# Patient Record
Sex: Male | Born: 1980 | Hispanic: Refuse to answer | Marital: Married | Smoking: Former smoker
Health system: Southern US, Community
[De-identification: ages and names within clinical notes are randomized; demographics above are authoritative.]

## PROBLEM LIST (undated history)

## (undated) DIAGNOSIS — I82409 Acute embolism and thrombosis of unspecified deep veins of unspecified lower extremity: Secondary | ICD-10-CM

## (undated) DIAGNOSIS — T7840XA Allergy, unspecified, initial encounter: Secondary | ICD-10-CM

## (undated) DIAGNOSIS — E785 Hyperlipidemia, unspecified: Secondary | ICD-10-CM

## (undated) HISTORY — DX: Hyperlipidemia, unspecified: E78.5

## (undated) HISTORY — DX: Acute embolism and thrombosis of unspecified deep veins of unspecified lower extremity: I82.409

## (undated) HISTORY — DX: Allergy, unspecified, initial encounter: T78.40XA

## (undated) HISTORY — PX: WISDOM TOOTH EXTRACTION: SHX21

---

## 2019-10-28 ENCOUNTER — Ambulatory Visit: Payer: BC Managed Care – PPO | Attending: Internal Medicine

## 2019-10-28 DIAGNOSIS — Z23 Encounter for immunization: Secondary | ICD-10-CM

## 2019-10-28 NOTE — Progress Notes (Signed)
   Covid-19 Vaccination Clinic  Name:  Gabriel Burgess    MRN: [id number] DOB: Aug 03, 1981  10/28/2019  Mr. Gabriel Burgess was observed post Covid-19 immunization for 15 minutes without incident. He was provided with Vaccine Information Sheet and instruction to access the V-Safe system.   Mr. Gabriel Burgess was instructed to call 911 with any severe reactions post vaccine: Marland Kitchen Difficulty breathing  . Swelling of face and throat  . A fast heartbeat  . A bad rash all over body  . Dizziness and weakness   Immunizations Administered    Name Date Dose VIS Date Route   Pfizer COVID-19 Vaccine 10/28/2019 12:14 PM 0.3 mL 07/22/2019 Intramuscular   Manufacturer: [employer], [employer]   Lot: [id number]   NDC: [address]-2

## 2019-11-22 ENCOUNTER — Ambulatory Visit: Payer: BC Managed Care – PPO | Attending: Internal Medicine

## 2019-11-22 DIAGNOSIS — Z23 Encounter for immunization: Secondary | ICD-10-CM

## 2019-11-22 NOTE — Progress Notes (Signed)
   Covid-19 Vaccination Clinic  Name:  Gabriel Burgess    MRN: [id number] DOB: 23-Jan-1981  11/22/2019  [name] was observed post Covid-19 immunization for 15 minutes without incident. He was provided with Vaccine Information Sheet and instruction to access the V-Safe system.   [name] was instructed to call 911 with any severe reactions post vaccine: Marland Kitchen Difficulty breathing  . Swelling of face and throat  . A fast heartbeat  . A bad rash all over body  . Dizziness and weakness   Immunizations Administered    Name Date Dose VIS Date Route   Pfizer COVID-19 Vaccine 11/22/2019  1:19 PM 0.3 mL 07/22/2019 Intramuscular   Manufacturer: [employer], [employer]   Lot: [id number]   NDC: [address]-1

## 2020-04-30 DIAGNOSIS — Z20828 Contact with and (suspected) exposure to other viral communicable diseases: Secondary | ICD-10-CM | POA: Diagnosis not present

## 2021-10-06 ENCOUNTER — Encounter (HOSPITAL_COMMUNITY): Payer: Self-pay | Admitting: Emergency Medicine

## 2021-10-06 ENCOUNTER — Emergency Department (HOSPITAL_COMMUNITY): Payer: BC Managed Care – PPO

## 2021-10-06 ENCOUNTER — Emergency Department (HOSPITAL_COMMUNITY)
Admission: EM | Admit: 2021-10-06 | Discharge: 2021-10-06 | Disposition: A | Discharge status: Home / Self Care | Payer: BC Managed Care – PPO | Attending: Emergency Medicine | Admitting: Emergency Medicine

## 2021-10-06 ENCOUNTER — Emergency Department (HOSPITAL_BASED_OUTPATIENT_CLINIC_OR_DEPARTMENT_OTHER): Payer: BC Managed Care – PPO

## 2021-10-06 ENCOUNTER — Other Ambulatory Visit: Payer: Self-pay

## 2021-10-06 DIAGNOSIS — M79661 Pain in right lower leg: Secondary | ICD-10-CM | POA: Diagnosis not present

## 2021-10-06 DIAGNOSIS — Z7901 Long term (current) use of anticoagulants: Secondary | ICD-10-CM | POA: Insufficient documentation

## 2021-10-06 DIAGNOSIS — I82451 Acute embolism and thrombosis of right peroneal vein: Secondary | ICD-10-CM | POA: Diagnosis not present

## 2021-10-06 DIAGNOSIS — R0602 Shortness of breath: Secondary | ICD-10-CM | POA: Diagnosis not present

## 2021-10-06 DIAGNOSIS — Z0389 Encounter for observation for other suspected diseases and conditions ruled out: Secondary | ICD-10-CM | POA: Diagnosis not present

## 2021-10-06 DIAGNOSIS — I82401 Acute embolism and thrombosis of unspecified deep veins of right lower extremity: Secondary | ICD-10-CM | POA: Diagnosis not present

## 2021-10-06 LAB — CBC WITH DIFFERENTIAL/PLATELET
Abs Immature Granulocytes: 0.04 10*3/uL (ref 0.00–0.07)
Basophils Absolute: 0.1 10*3/uL (ref 0.0–0.1)
Basophils Relative: 1 %
Eosinophils Absolute: 0.2 10*3/uL (ref 0.0–0.5)
Eosinophils Relative: 3 %
HCT: 44.8 % (ref 39.0–52.0)
Hemoglobin: 14.7 g/dL (ref 13.0–17.0)
Immature Granulocytes: 0 %
Lymphocytes Relative: 20 %
Lymphs Abs: 1.9 10*3/uL (ref 0.7–4.0)
MCH: 29.6 pg (ref 26.0–34.0)
MCHC: 32.8 g/dL (ref 30.0–36.0)
MCV: 90.1 fL (ref 80.0–100.0)
Monocytes Absolute: 0.9 10*3/uL (ref 0.1–1.0)
Monocytes Relative: 9 %
Neutro Abs: 6.5 10*3/uL (ref 1.7–7.7)
Neutrophils Relative %: 67 %
Platelets: 321 10*3/uL (ref 150–400)
RBC: 4.97 MIL/uL (ref 4.22–5.81)
RDW: 12 % (ref 11.5–15.5)
WBC: 9.6 10*3/uL (ref 4.0–10.5)
nRBC: 0 % (ref 0.0–0.2)

## 2021-10-06 LAB — BASIC METABOLIC PANEL
Anion gap: 10 (ref 5–15)
BUN: 11 mg/dL (ref 6–20)
CO2: 23 mmol/L (ref 22–32)
Calcium: 9.3 mg/dL (ref 8.9–10.3)
Chloride: 106 mmol/L (ref 98–111)
Creatinine, Ser: 0.97 mg/dL (ref 0.61–1.24)
GFR, Estimated: >60 mL/min (ref >60)
Glucose, Bld: 114 mg/dL — ABNORMAL HIGH (ref 70–99)
Potassium: 3.9 mmol/L (ref 3.5–5.1)
Sodium: 139 mmol/L (ref 135–145)

## 2021-10-06 MED ORDER — IOHEXOL 350 MG/ML SOLN
75.0000 mL | Freq: Once | INTRAVENOUS | Status: AC | PRN
  Administered 2021-10-06: 75 mL via INTRAVENOUS

## 2021-10-06 MED ORDER — RIVAROXABAN (XARELTO) VTE STARTER PACK (15 & 20 MG): ORAL_TABLET | ORAL | 0 refills | Status: DC

## 2021-10-06 MED ORDER — RIVAROXABAN 15 MG PO TABS
15.0000 mg | ORAL_TABLET | Freq: Once | ORAL | Status: AC
  Administered 2021-10-06: 15 mg via ORAL
  Filled 2021-10-06: qty 1

## 2021-10-06 MED ORDER — RIVAROXABAN (XARELTO) EDUCATION KIT FOR DVT/PE PATIENTS
PACK | Freq: Once | Status: AC
  Filled 2021-10-06: qty 1

## 2021-10-06 NOTE — ED Notes (Signed)
Patient transported to CT 

## 2021-10-06 NOTE — ED Triage Notes (Signed)
Pt reports R calf pain x 4-5 days.  States now having posterior R thigh pain.  C/o increased pain today with redness and swelling.

## 2021-10-06 NOTE — Discharge Instructions (Addendum)
Please return to the ED with any new symptoms such as worsening right calf pain Please pick up the prescription we have sent to your pharmacy.  Please follow directions concerning this medication and asked the pharmacist for any directions. Please call the PCP I referred you on Monday to schedule appointment.  I have attached the number to this document.  She is to [facility]. I have also sent an ambulatory referral to another PCP.  If they were to call you back on Monday, you have your choice of which PCP want to go to. Please avoid taking excessive NSAID therapy. Please read the attached informational brochure I have included that educates on deep vein thromboses.

## 2021-10-06 NOTE — ED Provider Notes (Signed)
[facility] EMERGENCY DEPARTMENT Provider Note   CSN: [id number] Arrival date & time: 10/06/21  0857     History  Chief Complaint  Patient presents with   Leg Pain    Gabriel Burgess is a 41 y.o. male with no provided medical history.  Patient presents ED for evaluation of right lower extremity pain.  Patient states that his right calf began to ache 4 to 5 days ago.  Patient states that the pain comes and goes and is described as a "deep ache".  Patient reports that he has taken Aleve which tends to alleviate the pain but the pain always returns after the medication wears off.  Patient states that beginning yesterday the pain acutely worsened and is now also located in his posterior right thigh.  Patient also endorsing shortness of breath.  Patient also believes that his right leg is more swollen than left.  The patient denies any recent travel or surgery, history of blood clots in family.  Patient denies any recent trauma or excessive exercise.   Leg Pain     Home Medications Prior to Admission medications   Medication Sig Start Date End Date Taking? Authorizing Provider  RIVAROXABAN Alveda Reasons) VTE STARTER PACK (15 & 20 MG) Follow package directions: Take one 47m tablet by mouth twice a day. On day 22, switch to one 216mtablet once a day. Take with food. 10/06/21  Yes GrAzucena CecilPA-C      Allergies    Patient has no known allergies.    Review of Systems   Review of Systems  Respiratory:  Positive for shortness of breath.   Cardiovascular:  Positive for leg swelling (Unilateral on right side).  Musculoskeletal:  Positive for myalgias (Right leg).  All other systems reviewed and are negative.  Physical Exam Updated Vital Signs BP (!) 141/71 (BP Location: Right Arm)    Pulse 73    Temp 98.4 F (36.9 C) (Oral)    Resp 16    SpO2 97%  Physical Exam Vitals and nursing note reviewed.  Constitutional:      General: He is not in acute distress.     Appearance: He is not ill-appearing, toxic-appearing or diaphoretic.  HENT:     Head: Normocephalic and atraumatic.     Nose: Nose normal. No congestion.     Mouth/Throat:     Mouth: Mucous membranes are moist.  Eyes:     Extraocular Movements: Extraocular movements intact.     Conjunctiva/sclera: Conjunctivae normal.     Pupils: Pupils are equal, round, and reactive to light.  Cardiovascular:     Rate and Rhythm: Normal rate and regular rhythm.  Pulmonary:     Effort: Pulmonary effort is normal.     Breath sounds: Normal breath sounds. No wheezing.  Abdominal:     General: Abdomen is flat.     Palpations: Abdomen is soft.     Tenderness: There is no abdominal tenderness.  Musculoskeletal:     Cervical back: Normal range of motion and neck supple. No tenderness.     Right lower leg: Tenderness present. No edema.     Left lower leg: Normal.  Skin:    General: Skin is warm and dry.     Capillary Refill: Capillary refill takes less than 2 seconds.  Neurological:     Mental Status: He is alert and oriented to person, place, and time.    ED Results / Procedures / Treatments   Labs (all labs  ordered are listed, but only abnormal results are displayed) Labs Reviewed  BASIC METABOLIC PANEL - Abnormal; Notable for the following components:      Result Value   Glucose, Bld 114 (*)    All other components within normal limits  CBC WITH DIFFERENTIAL/PLATELET    EKG None  Radiology CT Angio Chest PE W and/or Wo Contrast  Result Date: 10/06/2021 CLINICAL DATA:  Pulmonary embolism (PE) suspected, high prob positive DVT study EXAM: CT ANGIOGRAPHY CHEST WITH CONTRAST TECHNIQUE: Multidetector CT imaging of the chest was performed using the standard protocol during bolus administration of intravenous contrast. Multiplanar CT image reconstructions and MIPs were obtained to evaluate the vascular anatomy. RADIATION DOSE REDUCTION: This exam was performed according to the departmental  dose-optimization program which includes automated exposure control, adjustment of the mA and/or kV according to patient size and/or use of iterative reconstruction technique. CONTRAST:  18m OMNIPAQUE IOHEXOL 350 MG/ML SOLN COMPARISON:  None. FINDINGS: Cardiovascular: Suboptimal opacification of the pulmonary arteries due to contrast bolus timing. No central or lobar acute pulmonary embolism. Possible distal segmental and subsegmental emboli right upper and lower lobes. Normal heart size. No pericardial effusion. Mediastinum/Nodes: Normal thyroid. No enlarged nodes. Esophagus is unremarkable. Lungs/Pleura: No consolidation or mass. No pleural effusion or pneumothorax. Upper Abdomen: No acute abnormality. Musculoskeletal: No chest wall abnormality. No acute or significant osseous findings. Review of the MIP images confirms the above findings. IMPRESSION: Suboptimal contrast bolus timing. No central or lobar acute pulmonary embolism. Possible distal segmental and subsegmental emboli right upper and lower lobes could also reflect artifact. Electronically Signed   By: PMacy MisM.D.   On: 10/06/2021 11:24   VAS [place]OWER EXTREMITY VENOUS (DVT) (ONLY [facility] & [facility])  Result Date: 10/06/2021  Lower Venous DVT Study Patient Name:  Gabriel Burgess Date of Exam:   10/06/2021 Medical Rec #: [id number]      Accession #:    [id number] of Birth: [birth year]     Patient Gender: M Patient Age:   89years [facility]      VAS [place]OWER EXTREMITY VENOUS (DVT) Referring Phys: CHarrell GaveGROCE --------------------------------------------------------------------------------  Indications: Calf pain X 3 to 4 days.  Comparison Study: No prior study on file Performing Technologist: CSharion DoveRVS  Examination Guidelines: A complete evaluation includes B-mode imaging, spectral Doppler, color Doppler, and power Doppler as needed of all accessible portions of each vessel. Bilateral testing is  considered an integral part of a complete examination. Limited examinations for reoccurring indications may be performed as noted. The reflux portion of the exam is performed with the patient in reverse Trendelenburg.  +---------+---------------+---------+-----------+----------+--------------+  RIGHT     Compressibility Phasicity Spontaneity Properties Thrombus Aging  +---------+---------------+---------+-----------+----------+--------------+  CFV       Full            Yes       Yes                                    +---------+---------------+---------+-----------+----------+--------------+  SFJ       Full                                                             +---------+---------------+---------+-----------+----------+--------------+  FV Prox   Full                                                             +---------+---------------+---------+-----------+----------+--------------+  FV Mid    Full                                                             +---------+---------------+---------+-----------+----------+--------------+  FV Distal Full                                                             +---------+---------------+---------+-----------+----------+--------------+  PFV       Full                                                             +---------+---------------+---------+-----------+----------+--------------+  POP       None            No        No                     Acute           +---------+---------------+---------+-----------+----------+--------------+  PTV       None                                             Acute           +---------+---------------+---------+-----------+----------+--------------+  PERO      None                                             Acute           +---------+---------------+---------+-----------+----------+--------------+   +----+---------------+---------+-----------+----------+--------------+   LEFT Compressibility Phasicity Spontaneity Properties Thrombus Aging  +----+---------------+---------+-----------+----------+--------------+  CFV  Full            Yes       Yes                                    +----+---------------+---------+-----------+----------+--------------+     Summary: RIGHT: - Findings consistent with acute deep vein thrombosis involving the right popliteal vein, right posterior tibial veins, and right peroneal veins. - No cystic structure found in the popliteal fossa.  LEFT: - No evidence of common femoral vein obstruction.  *See table(s) above for measurements and observations.    Preliminary     Procedures Procedures    Medications  Ordered in ED Medications  Rivaroxaban (XARELTO) tablet 15 mg (has no administration in time range)  rivaroxaban (XARELTO) Education Kit for DVT/PE patients (has no administration in time range)  iohexol (OMNIPAQUE) 350 MG/ML injection 75 mL (75 mLs Intravenous Contrast Given 10/06/21 1117)    ED Course/ Medical Decision Making/ A&P                           Medical Decision Making Amount and/or Complexity of Data Reviewed Labs: ordered. ECG/medicine tests: ordered.   41 year old male presents to ED for evaluation of right calf pain.  Patient states that the Calf pain has acutely worsened over the last 2 days which is caused him to present to the ED. On examination, the patient's right calf has no signs of erythema, ecchymosis.  There is slight tenderness to palpation of the right calf when the patient has his right foot dorsiflexed, positive Homans' sign.  Patient's right foot has 2+ DP pulse, less than 2-second capillary refill.  Patient worked up utilizing following labs and imaging studies: -CBC: Unremarkable - BMP: Unremarkable - DVT ultrasound: Shows acute DVT in the right popliteal, right peroneal, posterior tibial veins.  Due to patient also complaining of shortness of breath, we will proceed with CT angiogram.   - CT  angiogram: Shows possible distal and subsegmental emboli in the right upper and lower lobe.  Could also reflect artifact.  At this time, the patient is stable for discharge.  I will begin the patient on 3 months of anticoagulation, Xarelto starter pack.  I have provided the patient with education regarding his diagnosis as well as the medication he is on now.  I provided the patient with discharge precautions as well as precautions to take while on the blood thinning medication.  I have advised the patient that if he were to get a car accident or hit his head he needs to present immediately to the ED for CT head to rule out a brain bleed.  The patient does not have a PCP, I have referred him to one.  I sent an ambulatory referral to a PCP provider in the area as well as referred him to the [facility].  I have advised him that he will need close follow-up and he will need to get into see a PCP this week.  I have advised the patient that if he is unable to get to a PCP, needs to return to the ED for further evaluation and management concerning his DVT.  Patient had all of his questions answered to his satisfaction.  The patient is stable for discharge at this time.   Final Clinical Impression(s) / ED Diagnoses Final diagnoses:  Acute deep vein thrombosis (DVT) of right peroneal vein ([place])    Rx / DC Orders ED Discharge Orders          Ordered    Ambulatory referral to Family Practice       Comments: DVT positive patient recently started on Xarelto starter pack. Does not have PCP and needs close follow up.   10/06/21 1151    RIVAROXABAN (XARELTO) VTE STARTER PACK (15 & 20 MG)        10/06/21 1151              Lawana Chambers 10/06/21 [place], Ankit, MD 10/07/21 1058

## 2021-10-06 NOTE — Progress Notes (Signed)
VASCULAR LAB    Right lower extremity venous duplex has been performed.  See CV proc for preliminary results.  Messaged results to Delice Bison, PA-C via secure chat.  Adeana Grilliot, RVT 10/06/2021, 10:23 AM

## 2021-10-08 ENCOUNTER — Encounter: Payer: Self-pay | Admitting: Nurse Practitioner

## 2021-10-08 ENCOUNTER — Other Ambulatory Visit: Payer: Self-pay

## 2021-10-08 ENCOUNTER — Ambulatory Visit (INDEPENDENT_AMBULATORY_CARE_PROVIDER_SITE_OTHER): Payer: BC Managed Care – PPO | Admitting: Nurse Practitioner

## 2021-10-08 VITALS — BP 123/78 | HR 82

## 2021-10-08 DIAGNOSIS — I82451 Acute embolism and thrombosis of right peroneal vein: Secondary | ICD-10-CM | POA: Diagnosis not present

## 2021-10-08 NOTE — Patient Instructions (Addendum)
DVT Possible PE:  Outpatient Encounter Medications as of 10/08/2021  Medication Sig   RIVAROXABAN (XARELTO) VTE STARTER PACK (15 & 20 MG) Follow package directions: Take one 15mg  tablet by mouth twice a day. On day 22, switch to one 20mg  tablet once a day. Take with food.   No facility-administered encounter medications on file as of 10/08/2021.     Continue xarelto  Heart healthy diet  Follow up:  Follow up in 4 weeks - Physical and follow up DVT/anticoagulation    Rivaroxaban Tablets What is this medication? RIVAROXABAN (ri va ROX a ban) prevents or treats blood clots. It is also used to lower the risk of stroke in people with AFib (atrial fibrillation). It can be used to lower the risk of heart attack or stroke in people with heart or peripheral artery disease. It belongs to a group of medications called blood thinners. This medicine may be used for other purposes; ask your health care provider or pharmacist if you have questions. COMMON BRAND NAME(S): Xarelto, Xarelto Starter Pack What should I tell my care team before I take this medication? They need to know if you have any of these conditions: Antiphospholipid antibody syndrome Bleeding disorders Bleeding in the brain Blood clots Kidney disease Liver disease Prosthetic heart valve Recent or planned spinal or epidural procedure Stomach bleeding Take medications that treat or prevent blood clots An unusual or allergic reaction to rivaroxaban, other medications, foods, dyes, or preservatives Pregnant or trying to get pregnant Breast-feeding How should I use this medication? Take this medication by mouth. For your therapy to work as well as possible, take each dose exactly as prescribed on the prescription label. Do not skip doses. Skipping doses or stopping this medication can increase your risk of a blood clot or stroke. Keep taking this medication unless your care team tells you to stop. If you are taking this  medication after hip or knee replacement surgery, take it with or without food. If you are taking this medication for atrial fibrillation, take it with your evening meal. If you are taking this medication to treat blood clots, take it with food at the same time each day. If you are taking this medication for coronary artery disease or peripheral artery disease, take it with or without food at the same time every day. If you are unable to swallow your tablet, you may crush the tablet and mix it in applesauce. Then, immediately eat the applesauce. You should eat more food right after you eat the applesauce containing the crushed tablet. A special MedGuide will be given to you by the pharmacist with each prescription and refill. Be sure to read this information carefully each time. Talk to your care team about the use of this medication in children. While it may be prescribed for children as young as newborns for selected conditions, precautions do apply. Overdosage: If you think you have taken too much of this medicine contact a poison control center or emergency room at once. NOTE: This medicine is only for you. Do not share this medicine with others. What if I miss a dose? If you take your medication once a day and miss a dose, take it as soon as you can. If it is almost time for your next dose, take only that dose. Do not take double or extra doses. If you are taking this medication twice a day to treat blood clots and miss a dose, take the missed dose as soon as you remember. In  this instance, 2 tablets may be taken at the same time. The next day you should take 1 tablet twice a day. If you are taking this medication twice a day for coronary artery disease or peripheral artery disease and miss a dose, skip it. Take your next dose at the normal time. Do not take extra or 2 doses at the same time to make up for the missed dose. What may interact with this medication? Do not take this medication with any of  the following: Defibrotide This medication may also interact with the following: Aspirin and aspirin-like medications Certain antibiotics like erythromycin and clarithromycin Certain medications for fungal infections like ketoconazole and itraconazole Certain medications for seizures like carbamazepine, phenytoin Certain medications that treat or prevent blood clots like warfarin, enoxaparin, dalteparin, apixaban, dabigatran, and edoxaban Conivaptan Indinavir Lopinavir; ritonavir NSAIDS, medications for pain and inflammation, like ibuprofen or naproxen Rifampin Ritonavir SNRIs, medications for depression, like desvenlafaxine, duloxetine, levomilnacipran, venlafaxine SSRIs, medications for depression, like citalopram, escitalopram, fluoxetine, fluvoxamine, paroxetine, sertraline St. John's wort This list may not describe all possible interactions. Give your health care provider a list of all the medicines, herbs, non-prescription drugs, or dietary supplements you use. Also tell them if you smoke, drink alcohol, or use illegal drugs. Some items may interact with your medicine. What should I watch for while using this medication? Visit your care team for regular checks on your progress. You may need blood work done while you are taking this medication. Your condition will be monitored carefully while you are receiving this medication. It is important not to miss any appointments. Avoid sports and activities that might cause injury while you are using this medication. Severe falls or injuries can cause unseen bleeding. Be careful when using sharp tools or knives. Consider using an Neurosurgeon. Take special care brushing or flossing your teeth. Report any injuries, bruising, or red spots on the skin to your care team. Wear a medical ID bracelet or chain. Carry a card that describes your condition. List the medications and doses you take on the card. Tell your dentist and dental surgeon that you  are taking this medication. You should not have major dental surgery while on this medication. See your dentist to have a dental exam and fix any dental problems before starting this medication. Take good care of your teeth while on this medication. Make sure you see your dentist for regular follow-up appointments. If you are going to need surgery or other procedure, tell your care team that you are using this medication. Do not become pregnant while taking this medication. Women should inform their care team if they wish to become pregnant or think they might be pregnant. There is potential for serious harm to an unborn child. Talk to your care team for more information. What side effects may I notice from receiving this medication? Side effects that you should report to your care team as soon as possible: Allergic reactions--skin rash, itching, hives, swelling of the face, lips, tongue, or throat Bleeding--bloody or black, tar-like stools, vomiting blood or brown material that looks like coffee grounds, red or dark brown urine, small red or purple spots on skin, unusual bruising or bleeding Bleeding in the brain--severe headache, stiff neck, confusion, dizziness, change in vision, numbness or weakness of the face, arm, or leg, trouble speaking, trouble walking, vomiting Heavy periods This list may not describe all possible side effects. Call your doctor for medical advice about side effects. You may report side effects  to FDA at 1-800-FDA-1088. Where should I keep my medication? Keep out of the reach of children and pets. Store at room temperature between 20 and 25 degrees C (68 and 77 degrees F). Get rid of any unused medication after the expiration date. To get rid of medications that are no longer needed or have expired: Take the medication to a medication take-back program. Check your pharmacy or law enforcement to find a location. If you cannot return the medication, check the label or package  insert to see if the medication should be thrown out in the garbage or flushed down the toilet. If you are not sure, ask your care team. If it is safe to put it in the trash, empty the medication out of the container. Mix the medication with cat litter, dirt, coffee grounds, or other unwanted substance. Seal the mixture in a bag or container. Put it in the trash. NOTE: This sheet is a summary. It may not cover all possible information. If you have questions about this medicine, talk to your doctor, pharmacist, or health care provider.  2022 Elsevier/Gold Standard (2020-09-21 00:00:00)   Deep Vein Thrombosis Deep vein thrombosis (DVT) is a condition in which a blood clot forms in a vein of the deep venous system. This can occur in the lower leg, thigh, pelvis, arm, or neck. A clot is blood that has thickened into a gel or solid. This condition is serious and can be life-threatening if the clot travels to the arteries of the lungs and causes a blockage (pulmonary embolism). A DVT can also damage veins in the leg, which can lead to long-term venous disease, leg pain, swelling, discoloration, and ulcers or sores (post-thrombotic syndrome). What are the causes? This condition may be caused by: A slowdown of blood flow. Damage to a vein. A condition that causes blood to clot more easily, such as certain bleeding disorders. What increases the risk? The following factors may make you more likely to develop this condition: Obesity. Being older, especially older than age 42. Being inactive or not moving around (sedentary lifestyle). This may include: Sitting or lying down for longer than 4-6 hours other than to sleep at night. Being in the hospital, or having major or lengthy surgery. Having any recent bone injuries, such as breaks (fractures), that reduce movement, especially in the lower extremities. Having recent orthopedic surgery on the lower extremities. Being pregnant, giving birth, or having  recently given birth. Taking medicines that contain estrogen, such as birth control or hormone replacement therapy. Using products that contain nicotine or tobacco, especially if you use hormonal birth control. Having a history of a blood vessel disease (peripheral vascular disease) or congestive heart disease. Having a history of cancer, especially if being treated with chemotherapy. What are the signs or symptoms? Symptoms of this condition include: Swelling, pain, pressure, or tenderness in an arm or a leg. An arm or a leg becoming warm, red, or discolored. A leg turning very pale or blue. You may have a large DVT. This is rare. If the clot is in your leg, you may notice that symptoms get worse when you stand or walk. In some cases, there are no symptoms. How is this diagnosed? This condition is diagnosed with: Your medical history and a physical exam. Tests, such as: Blood tests to check how well your blood clots. Doppler ultrasound. This is the best way to find a DVT. CT venogram. Contrast dye is injected into a vein, and X-rays are taken to  check for clots. This is helpful for veins in the chest or pelvis. How is this treated? Treatment for this condition depends on: The cause of your DVT. The size and location of your DVT, or having more than one DVT. Your risk for bleeding or developing more clots. Other medical conditions you may have. Treatment may include: Taking a blood thinner medicine (anticoagulant) to prevent more clots from forming or current clots from growing. Wearing compression stockings. Injecting medicines into the affected vein to break up the clot (catheter-directed thrombolysis). Surgical procedures, when DVT is severe or hard to treat. These may be done to: Isolate and remove your clot. Place an inferior vena cava (IVC) filter. This filter is placed into a large vein called the inferior vena cava to catch blood clots before they reach your lungs. You may get  some medical treatments for 6 months or longer. Follow these instructions at home: If you are taking blood thinners: Talk with your health care provider before you take any medicines that contain aspirin or NSAIDs, such as ibuprofen. These medicines increase your risk for dangerous bleeding. Take your medicine exactly as told, at the same time every day. Do not skip a dose. Do not take more than the prescribed dose. This is important. Ask your health care provider about foods and medicines that could change or interact with the way your blood thinner works. Avoid these foods and medicines if you are told to do so. Avoid anything that may cause bleeding or bruising. You may bleed more easily while taking blood thinners. Be very careful when using knives, scissors, or other sharp objects. Use an electric razor instead of a blade. Avoid activities that could cause injury or bruising, and follow instructions for preventing falls. Tell your health care provider if you have had any internal bleeding, bleeding ulcers, or neurologic diseases, such as strokes or cerebral aneurysms. Wear a medical alert bracelet or carry a card that lists what medicines you take. General instructions Take over-the-counter and prescription medicines only as told by your health care provider. Return to your normal activities as told by your health care provider. Ask your health care provider what activities are safe for you. If recommended, wear compression stockings as told by your health care provider. These stockings help to prevent blood clots and reduce swelling in your legs. Never wear your compression stockings while sleeping at night. Keep all follow-up visits. This is important. Where to find more information American Heart Association: www.heart.org Centers for Disease Control and Prevention: FootballExhibition.com.br National Heart, Lung, and Blood Institute: PopSteam.is Contact a health care provider if: You miss a  dose of your blood thinner. You have unusual bruising or other color changes. You have new or worse pain, swelling, or redness in an arm or a leg. You have worsening numbness or tingling in an arm or a leg. You have a significant color change (pale or blue) in the extremity that has the DVT. Get help right away if: You have signs or symptoms that a blood clot has moved to the lungs. These may include: Shortness of breath. Chest pain. Fast or irregular heartbeats (palpitations). Light-headedness, dizziness, or fainting. Coughing up blood. You have signs or symptoms that your blood is too thin. These may include: Blood in your vomit, stool, or urine. A cut that will not stop bleeding. A menstrual period that is heavier than usual. A severe headache or confusion. These symptoms may be an emergency. Get help right away. Call 911. Do  not wait to see if the symptoms will go away. Do not drive yourself to the hospital. Summary Deep vein thrombosis (DVT) happens when a blood clot forms in a deep vein. This may occur in the lower leg, thigh, pelvis, arm, or neck. Symptoms affect the arm or leg and can include swelling, pain, tenderness, warmth, redness, or discoloration. This condition may be treated with medicines. In severe cases, a procedure or surgery may be done to remove or dissolve the clots. If you are taking blood thinners, take them exactly as told. Do not skip a dose. Do not take more than is prescribed. Get help right away if you have a severe headache, shortness of breath, chest pain, fast or irregular heartbeats, or blood in your vomit, urine, or stool. This information is not intended to replace advice given to you by your health care provider. Make sure you discuss any questions you have with your health care provider. Document Revised: 02/18/2021 Document Reviewed: 02/18/2021 Elsevier Patient Education  2022 [employer]

## 2021-10-08 NOTE — Assessment & Plan Note (Signed)
Possible PE:  Outpatient Encounter Medications as of 10/08/2021  Medication Sig   RIVAROXABAN (XARELTO) VTE STARTER PACK (15 & 20 MG) Follow package directions: Take one 15mg  tablet by mouth twice a day. On day 22, switch to one 20mg  tablet once a day. Take with food.   No facility-administered encounter medications on file as of 10/08/2021.     Continue xarelto  Heart healthy diet  Follow up:  Follow up in 4 weeks - Physical and follow up DVT/anticoagulation

## 2021-10-08 NOTE — Progress Notes (Signed)
@Patient  ID: Gabriel Burgess, male    DOB: 1980-08-26, 41 y.o.   MRN: [id number]  Chief Complaint  Patient presents with   Hospitalization Follow-up    Referring provider: No ref. provider found   HPI   Patient presents today for a hospital follow-up.  He was seen in the ED on 10/06/2021 and diagnosed with DVT.  Ultrasound showed DVT in the right popliteal right peroneal, posterior tibial veins.  CT angiogram showed possible distal as so segmental emboli in the right upper and lower lobe.  It was noted that this could also reflect artifact.  Patient was started on anticoagulation therapy with Xarelto.  Patient states that he was able to get his prescription filled and has started the medication.  He states that overall he is doing well.  His vital signs are stable in the office today.  Patient does not currently have a PCP and would like to establish care here at this office.  He states that he has not had a physical since 2016.  We discussed that we will get him back in for complete physical in the near future. Denies f/c/s, n/v/d, hemoptysis, PND, chest pain or edema.     No Known Allergies  Immunization History  Administered Date(s) Administered   PFIZER(Purple Top)SARS-COV-2 Vaccination 10/28/2019, 11/22/2019    No past medical history on file.  Tobacco History: Social History   Tobacco Use  Smoking Status Never  Smokeless Tobacco Never   Counseling given: Yes   Outpatient Encounter Medications as of 10/08/2021  Medication Sig   RIVAROXABAN (XARELTO) VTE STARTER PACK (15 & 20 MG) Follow package directions: Take one 15mg  tablet by mouth twice a day. On day 22, switch to one 20mg  tablet once a day. Take with food.   No facility-administered encounter medications on file as of 10/08/2021.     Review of Systems  Review of Systems  Constitutional: Negative.   HENT: Negative.    Respiratory:  Positive for cough and shortness of breath.   Cardiovascular: Negative.    Gastrointestinal: Negative.   Musculoskeletal:        Slight redness and warmth to right calf  Allergic/Immunologic: Negative.   Neurological: Negative.   Psychiatric/Behavioral: Negative.        Physical Exam  BP 123/78    Pulse 82    SpO2 97%   Wt Readings from Last 5 Encounters:  10/06/21 200 lb (90.7 kg)     Physical Exam Vitals and nursing note reviewed.  Constitutional:      General: He is not in acute distress.    Appearance: He is well-developed.  Cardiovascular:     Rate and Rhythm: Normal rate and regular rhythm.  Pulmonary:     Effort: Pulmonary effort is normal.     Breath sounds: Normal breath sounds.  Skin:    General: Skin is warm and dry.  Neurological:     Mental Status: He is alert and oriented to person, place, and time.      Imaging: CT Angio Chest PE W and/or Wo Contrast  Result Date: 10/06/2021 CLINICAL DATA:  Pulmonary embolism (PE) suspected, high prob positive DVT study EXAM: CT ANGIOGRAPHY CHEST WITH CONTRAST TECHNIQUE: Multidetector CT imaging of the chest was performed using the standard protocol during bolus administration of intravenous contrast. Multiplanar CT image reconstructions and MIPs were obtained to evaluate the vascular anatomy. RADIATION DOSE REDUCTION: This exam was performed according to the departmental dose-optimization program which includes automated exposure control, adjustment of the  mA and/or kV according to patient size and/or use of iterative reconstruction technique. CONTRAST:  46mL OMNIPAQUE IOHEXOL 350 MG/ML SOLN COMPARISON:  None. FINDINGS: Cardiovascular: Suboptimal opacification of the pulmonary arteries due to contrast bolus timing. No central or lobar acute pulmonary embolism. Possible distal segmental and subsegmental emboli right upper and lower lobes. Normal heart size. No pericardial effusion. Mediastinum/Nodes: Normal thyroid. No enlarged nodes. Esophagus is unremarkable. Lungs/Pleura: No consolidation or mass.  No pleural effusion or pneumothorax. Upper Abdomen: No acute abnormality. Musculoskeletal: No chest wall abnormality. No acute or significant osseous findings. Review of the MIP images confirms the above findings. IMPRESSION: Suboptimal contrast bolus timing. No central or lobar acute pulmonary embolism. Possible distal segmental and subsegmental emboli right upper and lower lobes could also reflect artifact. Electronically Signed   By: Guadlupe Spanish M.D.   On: 10/06/2021 11:24   VAS [place] LOWER EXTREMITY VENOUS (DVT) (ONLY [facility] & [facility])  Result Date: 10/06/2021  Lower Venous DVT Study Patient Name:  JIDENNA FIGGS  Date of Exam:   10/06/2021 Medical Rec #: [id number]       Accession #:    [id number] Date of Birth: [birth year]      Patient Gender: M Patient Age:   3 years Exam Location:  [facility] Procedure:      VAS [place] LOWER EXTREMITY VENOUS (DVT) Referring Phys: Cristal Deer GROCE --------------------------------------------------------------------------------  Indications: Calf pain X 3 to 4 days.  Comparison Study: No prior study on file Performing Technologist: Sherren Kerns RVS  Examination Guidelines: A complete evaluation includes B-mode imaging, spectral Doppler, color Doppler, and power Doppler as needed of all accessible portions of each vessel. Bilateral testing is considered an integral part of a complete examination. Limited examinations for reoccurring indications may be performed as noted. The reflux portion of the exam is performed with the patient in reverse Trendelenburg.  +---------+---------------+---------+-----------+----------+--------------+  RIGHT     Compressibility Phasicity Spontaneity Properties Thrombus Aging  +---------+---------------+---------+-----------+----------+--------------+  CFV       Full            Yes       Yes                                    +---------+---------------+---------+-----------+----------+--------------+  SFJ       Full                                                              +---------+---------------+---------+-----------+----------+--------------+  FV Prox   Full                                                             +---------+---------------+---------+-----------+----------+--------------+  FV Mid    Full                                                             +---------+---------------+---------+-----------+----------+--------------+  FV Distal Full                                                             +---------+---------------+---------+-----------+----------+--------------+  PFV       Full                                                             +---------+---------------+---------+-----------+----------+--------------+  POP       None            No        No                     Acute           +---------+---------------+---------+-----------+----------+--------------+  PTV       None                                             Acute           +---------+---------------+---------+-----------+----------+--------------+  PERO      None                                             Acute           +---------+---------------+---------+-----------+----------+--------------+   +----+---------------+---------+-----------+----------+--------------+  LEFT Compressibility Phasicity Spontaneity Properties Thrombus Aging  +----+---------------+---------+-----------+----------+--------------+  CFV  Full            Yes       Yes                                    +----+---------------+---------+-----------+----------+--------------+     Summary: RIGHT: - Findings consistent with acute deep vein thrombosis involving the right popliteal vein, right posterior tibial veins, and right peroneal veins. - No cystic structure found in the popliteal fossa.  LEFT: - No evidence of common femoral vein obstruction.  *See table(s) above for measurements and observations. Electronically signed by Sherald Hess MD on 10/06/2021 at 12:09:35 PM.    Final       Assessment & Plan:   No problem-specific Assessment & Plan notes found for this encounter.     Ivonne Andrew, NP 10/08/2021

## 2021-10-25 NOTE — Progress Notes (Signed)
? ? ?Patient ID: Gabriel Burgess, male    DOB: 1980/08/27  MRN: [id number] ? ?CC: Annual Physical Exam ? ?Subjective: ?Gabriel Burgess is a 41 y.o. male who presents for annual physical exam.  ? ?His concerns today include:  ?DVT FOLLOW-UP: ?Right lower extremity pain has improved. Notices swelling when standing for extensive amounts of time. Would like to know how long he will have to remain on the medication. Also, concern for familial history of factor 5 deficiency. Having lower back pain for at least 12 months wants to know if NSAID's ok to take as Tylenol is not helping.  ? ?2. CLOGGED EAR, RIGHT: ?Persisting for at least 12 months. Noticed summer 2022 after his wife had Covid. Has noticed decreased hearing of the same ear. Denies frequent ear infections, sinus or allergy issues.  ? ? ?Patient Active Problem List  ? Diagnosis Date Noted  ? Acute deep vein thrombosis (DVT) of right peroneal vein (HCC) 10/08/2021  ?  ? ?Current Outpatient Medications on File Prior to Visit  ?Medication Sig Dispense Refill  ? RIVAROXABAN (XARELTO) VTE STARTER PACK (15 & 20 MG) Follow package directions: Take one 15mg  tablet by mouth twice a day. On day 22, switch to one 20mg  tablet once a day. Take with food. 51 each 0  ? ?No current facility-administered medications on file prior to visit.  ? ? ?Allergies  ?Allergen Reactions  ? Amoxicillin   ? ? ?Social History  ? ?Socioeconomic History  ? Marital status: Unknown  ?  Spouse name: Not on file  ? Number of children: Not on file  ? Years of education: Not on file  ? Highest education level: Not on file  ?Occupational History  ? Not on file  ?Tobacco Use  ? Smoking status: Former  ?  Packs/day: 0.50  ?  Types: Cigarettes  ?  Passive exposure: Past  ? Smokeless tobacco: Never  ?Substance and Sexual Activity  ? Alcohol use: Yes  ?  Comment: occassionally  ? Drug use: Never  ? Sexual activity: Yes  ?  Partners: Female  ?Other Topics Concern  ? Not on file  ?Social History Narrative  ?  Not on file  ? ?Social Determinants of Health  ? ?Financial Resource Strain: Not on file  ?Food Insecurity: Not on file  ?Transportation Needs: Not on file  ?Physical Activity: Not on file  ?Stress: Not on file  ?Social Connections: Not on file  ?Intimate Partner Violence: Not on file  ? ? ?No family history on file. ? ?History reviewed. No pertinent surgical history. ? ?ROS: ?Review of Systems ?Negative except as stated above ? ?PHYSICAL EXAM: ?BP 124/75 (BP Location: Left Arm, Patient Position: Sitting, Cuff Size: Normal)   Pulse 81   Temp 98.3 ?F (36.8 ?C)   Resp 18   Ht 5\' 10"  (1.778 m)   Wt 198 lb (89.8 kg)   SpO2 93%   BMI 28.41 kg/m?  ? ?Physical Exam ?HENT:  ?   Head: Normocephalic and atraumatic.  ?   Right Ear: Tympanic membrane, ear canal and external ear normal.  ?   Left Ear: Tympanic membrane, ear canal and external ear normal.  ?   Nose: Nose normal.  ?   Mouth/Throat:  ?   Mouth: Mucous membranes are moist.  ?   Pharynx: Oropharynx is clear.  ?Eyes:  ?   Extraocular Movements: Extraocular movements intact.  ?   Conjunctiva/sclera: Conjunctivae normal.  ?   Pupils: Pupils are equal,  round, and reactive to light.  ?Cardiovascular:  ?   Rate and Rhythm: Normal rate and regular rhythm.  ?   Pulses: Normal pulses.  ?   Heart sounds: Normal heart sounds.  ?Pulmonary:  ?   Effort: Pulmonary effort is normal.  ?   Breath sounds: Normal breath sounds.  ?Abdominal:  ?   General: Bowel sounds are normal.  ?   Palpations: Abdomen is soft.  ?Genitourinary: ?   Comments: Patient declined.  ?Musculoskeletal:     ?   General: Normal range of motion.  ?   Cervical back: Normal range of motion and neck supple.  ?Skin: ?   General: Skin is warm and dry.  ?   Capillary Refill: Capillary refill takes less than 2 seconds.  ?Neurological:  ?   General: No focal deficit present.  ?   Mental Status: He is alert and oriented to person, place, and time.  ?Psychiatric:     ?   Mood and Affect: Mood normal.     ?    Behavior: Behavior normal.  ? ? ?ASSESSMENT AND PLAN: ?1. Annual physical exam: ?- Counseled on 150 minutes of exercise per week as tolerated, healthy eating (including decreased daily intake of saturated fats, cholesterol, added sugars, sodium), STI prevention, and routine healthcare maintenance. ? ?2. Screening for metabolic disorder: ?- Hepatic panel to screen liver function. Patient plans to return tomorrow morning for fasting labs.  ?- Hepatic Function Panel; Future ? ?3. Diabetes mellitus screening: ?- Hemoglobin A1c to screen for pre-diabetes/diabetes. Patient plans to return tomorrow morning for fasting labs.  ?- Hemoglobin A1c; Future ? ?4. Screening cholesterol level: ?- Lipid panel to screen for high cholesterol. Patient plans to return tomorrow morning for fasting labs.  ?- Lipid panel; Future ? ?5. Thyroid disorder screen: ?- TSH to check thyroid function. Patient plans to return tomorrow morning for fasting labs.  ?- TSH; Future ? ?6. Need for hepatitis C screening test: ?- Hepatitis C antibody to screen for hepatitis C. Patient plans to return tomorrow morning for fasting labs.  ?- Hepatitis C Antibody; Future ? ?7. Encounter for screening for HIV: ?- HIV antibody to screen for human immunodeficiency virus. Patient plans to return tomorrow morning for fasting labs.  ?- HIV antibody (with reflex); Future ? ?8. Acute deep vein thrombosis (DVT) of right peroneal vein (HCC): ?- Continue Rivaroxaban as prescribed.  ?- Referral to Vascular Surgery for further evaluation and management.  ?- rivaroxaban (XARELTO) 20 MG TABS tablet; Take 1 tablet (20 mg total) by mouth daily with supper.  Dispense: 30 tablet; Refill: 0 ?- Ambulatory referral to Vascular Surgery ? ?9. Clogged ear, right: ?10. Decreased hearing, right: ?- Referral to ENT for further evaluation and management.  ?- Ambulatory referral to ENT ? ?11. Chronic low back pain, unspecified back pain laterality, unspecified whether sciatica present: ?-  Counseled to refrain from NSAID's use until advised by Vascular Surgery. Acetaminophen safer option. Instead discussed considering conservative measures such as heating pads and massage.  ?- Follow-up with primary provider as scheduled. ? ? ? ?Patient was given the opportunity to ask questions.  Patient verbalized understanding of the plan and was able to repeat key elements of the plan. Patient was given clear instructions to go to Emergency Department or return to medical center if symptoms don't improve, worsen, or new problems develop.The patient verbalized understanding. ? ? ?Orders Placed This Encounter  ?Procedures  ? Hemoglobin A1c  ? Hepatic Function Panel  ?  Hepatitis C Antibody  ? HIV antibody (with reflex)  ? Lipid panel  ? TSH  ? Ambulatory referral to Vascular Surgery  ? Ambulatory referral to ENT  ? ? ? ?Requested Prescriptions  ? ?Signed Prescriptions Disp Refills  ? rivaroxaban (XARELTO) 20 MG TABS tablet 30 tablet 0  ?  Sig: Take 1 tablet (20 mg total) by mouth daily with supper.  ? ? ?Return in about 1 year (around 10/31/2022) for Physical per patient preference. ? ?Rema FendtAmy J Darshay Deupree, NP  ?

## 2021-10-30 ENCOUNTER — Ambulatory Visit (INDEPENDENT_AMBULATORY_CARE_PROVIDER_SITE_OTHER): Payer: BC Managed Care – PPO | Admitting: Family

## 2021-10-30 ENCOUNTER — Other Ambulatory Visit: Payer: Self-pay

## 2021-10-30 ENCOUNTER — Encounter: Payer: Self-pay | Admitting: Family

## 2021-10-30 VITALS — BP 124/75 | HR 81 | Temp 98.3°F | Resp 18 | Ht 70.0 in | Wt 198.0 lb

## 2021-10-30 DIAGNOSIS — H938X1 Other specified disorders of right ear: Secondary | ICD-10-CM

## 2021-10-30 DIAGNOSIS — Z0000 Encounter for general adult medical examination without abnormal findings: Secondary | ICD-10-CM | POA: Diagnosis not present

## 2021-10-30 DIAGNOSIS — I82451 Acute embolism and thrombosis of right peroneal vein: Secondary | ICD-10-CM

## 2021-10-30 DIAGNOSIS — Z114 Encounter for screening for human immunodeficiency virus [HIV]: Secondary | ICD-10-CM

## 2021-10-30 DIAGNOSIS — Z13220 Encounter for screening for lipoid disorders: Secondary | ICD-10-CM

## 2021-10-30 DIAGNOSIS — G8929 Other chronic pain: Secondary | ICD-10-CM

## 2021-10-30 DIAGNOSIS — Z1159 Encounter for screening for other viral diseases: Secondary | ICD-10-CM

## 2021-10-30 DIAGNOSIS — Z13228 Encounter for screening for other metabolic disorders: Secondary | ICD-10-CM | POA: Diagnosis not present

## 2021-10-30 DIAGNOSIS — Z131 Encounter for screening for diabetes mellitus: Secondary | ICD-10-CM | POA: Diagnosis not present

## 2021-10-30 DIAGNOSIS — Z1329 Encounter for screening for other suspected endocrine disorder: Secondary | ICD-10-CM

## 2021-10-30 DIAGNOSIS — H9191 Unspecified hearing loss, right ear: Secondary | ICD-10-CM

## 2021-10-30 DIAGNOSIS — M545 Low back pain, unspecified: Secondary | ICD-10-CM

## 2021-10-30 MED ORDER — RIVAROXABAN 20 MG PO TABS: 20.0000 mg | ORAL_TABLET | Freq: Every day | ORAL | 0 refills | Status: DC

## 2021-10-30 NOTE — Patient Instructions (Signed)

## 2021-10-30 NOTE — Progress Notes (Signed)
Pt presents for annual exam will retun tomorrow morning to complete labs-not fasting ?Needs refill on Xarelto 20 mg  ?

## 2021-10-31 ENCOUNTER — Other Ambulatory Visit: Payer: Self-pay

## 2021-10-31 DIAGNOSIS — Z13220 Encounter for screening for lipoid disorders: Secondary | ICD-10-CM

## 2021-10-31 DIAGNOSIS — Z13228 Encounter for screening for other metabolic disorders: Secondary | ICD-10-CM | POA: Diagnosis not present

## 2021-10-31 DIAGNOSIS — Z1159 Encounter for screening for other viral diseases: Secondary | ICD-10-CM

## 2021-10-31 DIAGNOSIS — Z131 Encounter for screening for diabetes mellitus: Secondary | ICD-10-CM | POA: Diagnosis not present

## 2021-10-31 DIAGNOSIS — Z1329 Encounter for screening for other suspected endocrine disorder: Secondary | ICD-10-CM

## 2021-10-31 DIAGNOSIS — Z114 Encounter for screening for human immunodeficiency virus [HIV]: Secondary | ICD-10-CM

## 2021-11-01 ENCOUNTER — Other Ambulatory Visit: Payer: Self-pay | Admitting: Family

## 2021-11-01 DIAGNOSIS — E785 Hyperlipidemia, unspecified: Secondary | ICD-10-CM | POA: Insufficient documentation

## 2021-11-01 LAB — HEPATITIS C ANTIBODY: Hep C Virus Ab: NONREACTIVE

## 2021-11-01 LAB — LIPID PANEL
Chol/HDL Ratio: 7.2 ratio — ABNORMAL HIGH (ref 0.0–5.0)
Cholesterol, Total: 265 mg/dL — ABNORMAL HIGH (ref 100–199)
HDL: 37 mg/dL — ABNORMAL LOW (ref >39)
LDL Chol Calc (NIH): 200 mg/dL — ABNORMAL HIGH (ref 0–99)
Triglycerides: 147 mg/dL (ref 0–149)
VLDL Cholesterol Cal: 28 mg/dL (ref 5–40)

## 2021-11-01 LAB — HEMOGLOBIN A1C
Est. average glucose Bld gHb Est-mCnc: 111 mg/dL
Hgb A1c MFr Bld: 5.5 % (ref 4.8–5.6)

## 2021-11-01 LAB — HIV ANTIBODY (ROUTINE TESTING W REFLEX): HIV Screen 4th Generation wRfx: NONREACTIVE

## 2021-11-01 LAB — TSH: TSH: 2.79 u[IU]/mL (ref 0.450–4.500)

## 2021-11-01 LAB — HEPATIC FUNCTION PANEL
ALT: 31 IU/L (ref 0–44)
AST: 18 IU/L (ref 0–40)
Albumin: 4.9 g/dL (ref 4.0–5.0)
Alkaline Phosphatase: 104 IU/L (ref 44–121)
Bilirubin Total: 0.8 mg/dL (ref 0.0–1.2)
Bilirubin, Direct: 0.16 mg/dL (ref 0.00–0.40)
Total Protein: 7.8 g/dL (ref 6.0–8.5)

## 2021-11-01 LAB — SPECIMEN STATUS REPORT

## 2021-11-01 MED ORDER — ATORVASTATIN CALCIUM 20 MG PO TABS
20.0000 mg | ORAL_TABLET | Freq: Every day | ORAL | 0 refills | Status: DC
Start: 2021-11-01 — End: 2022-01-22

## 2021-11-01 NOTE — Progress Notes (Signed)
Liver function normal.  ? ?Thyroid function normal.  ? ?No diabetes.  ? ?Hepatitis C negative.  ? ?HIV negative.  ? ?Cholesterol higher than expected. High cholesterol may increase risk of heart attack and/or stroke. Consider eating more fruits, vegetables, and lean baked meats such as chicken or fish. Moderate intensity exercise at least 150 minutes as tolerated per week may help as well.  ? ?Begin Atorvastatin for high cholesterol. Encouraged to recheck fasting cholesterol in 4 to 6 weeks.

## 2021-11-25 ENCOUNTER — Encounter: Payer: Self-pay | Admitting: Surgery

## 2021-11-25 ENCOUNTER — Ambulatory Visit: Payer: BC Managed Care – PPO | Admitting: Surgery

## 2021-11-25 VITALS — BP 114/65 | HR 66 | Temp 98.0°F | Resp 20 | Ht 70.0 in | Wt 212.0 lb

## 2021-11-25 DIAGNOSIS — I82431 Acute embolism and thrombosis of right popliteal vein: Secondary | ICD-10-CM | POA: Diagnosis not present

## 2021-11-25 MED ORDER — RIVAROXABAN 20 MG PO TABS: 20.0000 mg | ORAL_TABLET | Freq: Every day | ORAL | 12 refills | Status: DC

## 2021-11-25 NOTE — Progress Notes (Signed)
? ?[facility] ? ?Patient name: Gabriel Burgess MRN: [id number] DOB: 1980/09/02 Sex: male ? ? ?REQUESTING PROVIDER:  ? ? Amy Zonia Kief ? ? ?REASON FOR CONSULT:  ?  ?DVT ? ?HISTORY OF PRESENT ILLNESS:  ? ?Gabriel Burgess is a 41 y.o. male, who is referred for evaluation of a right leg DVT.  This happened approximately 6 weeks ago.  There were no aggravating factors.  He denies any prolonged inactivity or illness including COVID.  He does state that the factor V Leiden mutation runs in his family.  He has several aunts with it as well as sister.  He has not had any prior history of DVT. ? ?He initially had pain and swelling when he developed his DVT.  These pain lasted for several days and was alleviated with Tylenol.  The swelling lasted approximately 1 month but has completely resolved.  He did wear compression socks however these caused itching so he discontinued them. ? ?PAST MEDICAL HISTORY  ? ? ?Past Medical History:  ?Diagnosis Date  ? DVT (deep venous thrombosis) (HCC)   ? right leg  ? ? ? ?FAMILY HISTORY  ? ?History reviewed. No pertinent family history. ? ?SOCIAL HISTORY:  ? ?Social History  ? ?Socioeconomic History  ? Marital status: Unknown  ?  Spouse name: Not on file  ? Number of children: Not on file  ? Years of education: Not on file  ? Highest education level: Not on file  ?Occupational History  ? Not on file  ?Tobacco Use  ? Smoking status: Former  ?  Packs/day: 0.50  ?  Types: Cigarettes  ?  Passive exposure: Past  ? Smokeless tobacco: Never  ?Vaping Use  ? Vaping Use: Never used  ?Substance and Sexual Activity  ? Alcohol use: Yes  ?  Comment: occassionally  ? Drug use: Never  ? Sexual activity: Yes  ?  Partners: Female  ?Other Topics Concern  ? Not on file  ?Social History Narrative  ? Not on file  ? ?Social Determinants of Health  ? ?Financial Resource Strain: Not on file  ?Food Insecurity: Not on file  ?Transportation Needs: Not on file   ?Physical Activity: Not on file  ?Stress: Not on file  ?Social Connections: Not on file  ?Intimate Partner Violence: Not on file  ? ? ?ALLERGIES:  ? ? ?Allergies  ?Allergen Reactions  ? Amoxicillin   ? ? ?CURRENT MEDICATIONS:  ? ? ?Current Outpatient Medications  ?Medication Sig Dispense Refill  ? atorvastatin (LIPITOR) 20 MG tablet Take 1 tablet (20 mg total) by mouth daily. 120 tablet 0  ? rivaroxaban (XARELTO) 20 MG TABS tablet Take 1 tablet (20 mg total) by mouth daily with supper. 30 tablet 0  ? ?No current facility-administered medications for this visit.  ? ? ?REVIEW OF SYSTEMS:  ? ?[X]  denotes positive finding, [ ]  denotes negative finding ?Cardiac  Comments:  ?Chest pain or chest pressure:    ?Shortness of breath upon exertion:    ?Short of breath when lying flat:    ?Irregular heart rhythm:    ?    ?Vascular    ?Pain in calf, thigh, or hip brought on by ambulation:    ?Pain in feet at night that wakes you up from your sleep:     ?Blood clot in your veins:    ?Leg swelling:  x   ?    ?Pulmonary    ?Oxygen at home:    ?Productive cough:     ?  Wheezing:     ?    ?Neurologic    ?Sudden weakness in arms or legs:     ?Sudden numbness in arms or legs:     ?Sudden onset of difficulty speaking or slurred speech:    ?Temporary loss of vision in one eye:     ?Problems with dizziness:     ?    ?Gastrointestinal    ?Blood in stool:     ? ?Vomited blood:     ?    ?Genitourinary    ?Burning when urinating:     ?Blood in urine:    ?    ?Psychiatric    ?Major depression:     ?    ?Hematologic    ?Bleeding problems:    ?Problems with blood clotting too easily:    ?    ?Skin    ?Rashes or ulcers:    ?    ?Constitutional    ?Fever or chills:    ? ?PHYSICAL EXAM:  ? ?There were no vitals filed for this visit. ? ?GENERAL: The patient is a well-nourished male, in no acute distress. The vital signs are documented above. ?CARDIAC: There is a regular rate and rhythm.  ?VASCULAR: No significant lower extremity edema.  I could not  palpate pedal pulses however he does have triphasic dorsalis pedis and posterior tibial Doppler signals. ?PULMONARY: Nonlabored respirations ?MUSCULOSKELETAL: There are no major deformities or cyanosis. ?NEUROLOGIC: No focal weakness or paresthesias are detected. ?SKIN: There are no ulcers or rashes noted. ?PSYCHIATRIC: The patient has a normal affect. ? ?STUDIES:  ? ?I have reviewed the following Venous duplex: ?RIGHT:  ?- Findings consistent with acute deep vein thrombosis involving the right  ?popliteal vein, right posterior tibial veins, and right peroneal veins.  ?- No cystic structure found in the popliteal fossa.  ?   ?LEFT:  ?- No evidence of common femoral vein obstruction.  ? ?ASSESSMENT and PLAN  ? ?Right leg DVT: This was a unprovoked event.  He does have a family history of factor V Leiden mutation.  He has never been tested.  I suspect that he has an underlying coagulopathy.  I am referring him to hematology for formal testing.  I refilled his Xarelto 20 mg to be taken daily. ? ? ?Durene Cal, IV, MD, FACS ?[facility] ?Tel [phone] ?Pager [phone]  ?

## 2021-12-05 ENCOUNTER — Telehealth: Payer: Self-pay | Admitting: Hematology and Oncology

## 2021-12-05 NOTE — Telephone Encounter (Signed)
Scheduled appt per 4/27 referral. Pt is aware of appt date and time. Pt is aware to arrive 15 mins prior to appt time and to bring and updated insurance card. Pt is aware of appt location.   ?

## 2021-12-22 NOTE — Progress Notes (Signed)
?[facility] ?[phone]   [phone] ? ?INITIAL CONSULT NOTE ? ?Patient Care Team: ?Rema Fendt, NP as PCP - General (Nurse Practitioner) ? ?Hematological/Oncological History ?# Family History of Factor V Leiden ?# Unprovoked Right Lower Extremity DVT  ?10/06/2021: Patient underwent a right lower extremity ultrasound which showed an acute deep vein thrombosis involving the right  ?popliteal vein, right posterior tibial veins, and right peroneal veins.  Same-day CT PE study showed no evidence of pulmonary embolism. ?12/22/2021: Establish care with Dr. Leonides Schanz ? ?CHIEF COMPLAINTS/PURPOSE OF CONSULTATION:  ?"Right Lower Extremity DVT  " ? ?HISTORY OF PRESENTING ILLNESS:  ?Gabriel Burgess 41 y.o. male with no remarkable past medical history who presents for evaluation of unprovoked right lower extremity DVT. ? ?On review of the previous records Gabriel Burgess underwent ultrasound on 10/06/2021 due to 3-4 days of calf pain.  The ultrasound showed an acute deep vein thrombosis involving the right popliteal vein, right posterior tibial veins, and right peroneal veins.  He was started on Xarelto therapy.  Patient was evaluated by vascular surgery on 11/25/2021 at which time he noted he had a family history of factor V Leiden.  Due to concern for these findings the patient was referred to hematology for further evaluation and management. ? ?On exam today Gabriel Burgess reports that his symptoms began with pain in his right calf.  He thought it was a muscle ache and therefore waited a few days before he presented to the emergency department.  He was also having some swelling and redness but it was very mild.  He reports that he has never had anything similar to this before in the past.  He reports that since taking Xarelto he has been having some problem with stomach aches and light sensitivity.  He also notes that the pain is resolving and his catheter is having a little more discomfort in his  foot.  He reports he is not having any residual swelling.  He is tolerating the blood thinner well with no bleeding, bruising, or dark stools. ? ?On further discussion the patient notes that there is a history of factor V Leiden on his mother side of the family.  He reports that his mother's sisters and cousins have been diagnosed with the disorder.  He also notes that this may have started with his maternal grandmother.  He reports that he is a former smoker having smoked in college about 1 pack/day.  He quit at the age of 48.  He does not vape or use any cigarettes.  He occasionally drinks alcohol.  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  He otherwise denies any fevers, chills, sweats, nausea, vomiting or diarrhea.  Full 10 point ROS is listed below. ? ?MEDICAL HISTORY:  ?Past Medical History:  ?Diagnosis Date  ? DVT (deep venous thrombosis) (HCC)   ? right leg  ? ? ?SURGICAL HISTORY: ?No past surgical history on file. ? ?SOCIAL HISTORY: ?Social History  ? ?Socioeconomic History  ? Marital status: Married  ?  Spouse name: Not on file  ? Number of children: Not on file  ? Years of education: Not on file  ? Highest education level: Not on file  ?Occupational History  ? Not on file  ?Tobacco Use  ? Smoking status: Former  ?  Packs/day: 0.50  ?  Types: Cigarettes  ?  Passive exposure: Past  ? Smokeless tobacco: Never  ?Vaping Use  ? Vaping Use: Never used  ?Substance  and Sexual Activity  ? Alcohol use: Yes  ?  Comment: occassionally  ? Drug use: Never  ? Sexual activity: Yes  ?  Partners: Female  ?Other Topics Concern  ? Not on file  ?Social History Narrative  ? Not on file  ? ?Social Determinants of Health  ? ?Financial Resource Strain: Not on file  ?Food Insecurity: Not on file  ?Transportation Needs: Not on file  ?Physical Activity: Not on file  ?Stress: Not on file  ?Social Connections: Not on file  ?Intimate Partner Violence: Not on file  ? ? ?FAMILY HISTORY: ?No family history on file. ? ?ALLERGIES:   is allergic to amoxicillin. ? ?MEDICATIONS:  ?Current Outpatient Medications  ?Medication Sig Dispense Refill  ? atorvastatin (LIPITOR) 20 MG tablet Take 1 tablet (20 mg total) by mouth daily. 120 tablet 0  ? rivaroxaban (XARELTO) 20 MG TABS tablet Take 1 tablet (20 mg total) by mouth daily with supper. 30 tablet 12  ? ?No current facility-administered medications for this visit.  ? ? ?REVIEW OF SYSTEMS:   ?Constitutional: ( - ) fevers, ( - )  chills , ( - ) night sweats ?Eyes: ( - ) blurriness of vision, ( - ) double vision, ( - ) watery eyes ?Ears, nose, mouth, throat, and face: ( - ) mucositis, ( - ) sore throat ?Respiratory: ( - ) cough, ( - ) dyspnea, ( - ) wheezes ?Cardiovascular: ( - ) palpitation, ( - ) chest discomfort, ( - ) lower extremity swelling ?Gastrointestinal:  ( - ) nausea, ( - ) heartburn, ( - ) change in bowel habits ?Skin: ( - ) abnormal skin rashes ?Lymphatics: ( - ) new lymphadenopathy, ( - ) easy bruising ?Neurological: ( - ) numbness, ( - ) tingling, ( - ) new weaknesses ?Behavioral/Psych: ( - ) mood change, ( - ) new changes  ?All other systems were reviewed with the patient and are negative. ? ?PHYSICAL EXAMINATION: ? ?Vitals:  ? 12/23/21 0914  ?BP: 131/72  ?Pulse: 66  ?Resp: 15  ?Temp: 97.6 ?F (36.4 ?C)  ?SpO2: 100%  ? ?Filed Weights  ? 12/23/21 0914  ?Weight: 209 lb 1.6 oz (94.8 kg)  ? ? ?GENERAL: well appearing middle-aged Caucasian male in NAD  ?SKIN: skin color, texture, turgor are normal, no rashes or significant lesions ?EYES: conjunctiva are pink and non-injected, sclera clear ?LUNGS: clear to auscultation and percussion with normal breathing effort ?HEART: regular rate & rhythm and no murmurs and no lower extremity edema ?Musculoskeletal: no cyanosis of digits and no clubbing  ?PSYCH: alert & oriented x 3, fluent speech ?NEURO: no focal motor/sensory deficits ? ?LABORATORY DATA:  ?I have reviewed the data as listed ? ?  Latest Ref Rng & Units 10/06/2021  ?  9:12 AM  ?CBC  ?WBC 4.0  - 10.5 K/uL 9.6    ?Hemoglobin 13.0 - 17.0 g/dL 01.6    ?Hematocrit 39.0 - 52.0 % 44.8    ?Platelets 150 - 400 K/uL 321    ? ? ? ?  Latest Ref Rng & Units 10/31/2021  ? 12:00 AM 10/06/2021  ?  9:12 AM  ?CMP  ?Glucose 70 - 99 mg/dL  553    ?BUN 6 - 20 mg/dL  11    ?Creatinine 0.61 - 1.24 mg/dL  7.48    ?Sodium 135 - 145 mmol/L  139    ?Potassium 3.5 - 5.1 mmol/L  3.9    ?Chloride 98 - 111 mmol/L  106    ?  CO2 22 - 32 mmol/L  23    ?Calcium 8.9 - 10.3 mg/dL  9.3    ?Total Protein 6.0 - 8.5 g/dL 7.8     ?Total Bilirubin 0.0 - 1.2 mg/dL 0.8     ?Alkaline Phos 44 - 121 IU/L 104     ?AST 0 - 40 IU/L 18     ?ALT 0 - 44 IU/L 31     ? ? ? ?ASSESSMENT & PLAN ?Gabriel Burgess 41 y.o. male with no remarkable past medical history who presents for evaluation of unprovoked right lower extremity DVT. ? ?After review of the labs, review of the records, and discussion with the patient the patients findings are most consistent with an unprovoked right lower extremity DVT. ? ?A provoked venous thromboembolism (VTE) is one that has a clear inciting factor or event. Provoking factors include prolonged travel/immobility, surgery (particular abdominal or orthropedic), trauma,  and pregnancy/ estrogen containing birth control. After a detailed history and review of the records there is no clear provoking factor for this patient?s VTE.  Patients with unprovoked VTEs have up to 25% recurrence after 5 years and 36% at 10 years, with 4% of these clots being fatal (BMJ?2019;366:l4363). Therefore the formal recommendation for unprovoked VTE?s is lifelong anticoagulation, as the cause may not be transient or reversible. We recommend 6 months or full strength anticoagulation with a re-evaluation after that time.  The patient?s will then have a choice of maintenance dose DOAC (preferred, recommended), 81mg  ASA PO daily (non-preferred), or no further anticoagulation (not recommended).   ? ?# Unprovoked DVT/Pulmonary Embolism  ?--findings at this time are  consistent with a unprovoked VTE  ?--will order baseline CMP and CBC to assure labs are adequate for DOAC therapy  ?--rule out APS with anticardiolipin and anti beta2 glycoprotein antibodies.  Lupus

## 2021-12-23 ENCOUNTER — Inpatient Hospital Stay: Payer: BC Managed Care – PPO | Attending: Hematology and Oncology | Admitting: Hematology and Oncology

## 2021-12-23 ENCOUNTER — Inpatient Hospital Stay: Payer: BC Managed Care – PPO

## 2021-12-23 ENCOUNTER — Other Ambulatory Visit: Payer: Self-pay

## 2021-12-23 VITALS — BP 131/72 | HR 66 | Temp 97.6°F | Resp 15 | Wt 209.1 lb

## 2021-12-23 DIAGNOSIS — I82461 Acute embolism and thrombosis of right calf muscular vein: Secondary | ICD-10-CM

## 2021-12-23 DIAGNOSIS — Z87891 Personal history of nicotine dependence: Secondary | ICD-10-CM

## 2021-12-23 DIAGNOSIS — I82451 Acute embolism and thrombosis of right peroneal vein: Secondary | ICD-10-CM | POA: Diagnosis not present

## 2021-12-23 DIAGNOSIS — I82431 Acute embolism and thrombosis of right popliteal vein: Secondary | ICD-10-CM | POA: Diagnosis not present

## 2021-12-23 DIAGNOSIS — Z7901 Long term (current) use of anticoagulants: Secondary | ICD-10-CM | POA: Diagnosis not present

## 2021-12-23 DIAGNOSIS — I82441 Acute embolism and thrombosis of right tibial vein: Secondary | ICD-10-CM | POA: Diagnosis not present

## 2021-12-23 DIAGNOSIS — Z832 Family history of diseases of the blood and blood-forming organs and certain disorders involving the immune mechanism: Secondary | ICD-10-CM | POA: Diagnosis not present

## 2021-12-23 LAB — CBC WITH DIFFERENTIAL (CANCER CENTER ONLY)
Abs Immature Granulocytes: 0.01 10*3/uL (ref 0.00–0.07)
Basophils Absolute: 0 10*3/uL (ref 0.0–0.1)
Basophils Relative: 1 %
Eosinophils Absolute: 0.2 10*3/uL (ref 0.0–0.5)
Eosinophils Relative: 3 %
HCT: 47.3 % (ref 39.0–52.0)
Hemoglobin: 16.3 g/dL (ref 13.0–17.0)
Immature Granulocytes: 0 %
Lymphocytes Relative: 41 %
Lymphs Abs: 2.5 10*3/uL (ref 0.7–4.0)
MCH: 29.9 pg (ref 26.0–34.0)
MCHC: 34.5 g/dL (ref 30.0–36.0)
MCV: 86.8 fL (ref 80.0–100.0)
Monocytes Absolute: 0.5 10*3/uL (ref 0.1–1.0)
Monocytes Relative: 7 %
Neutro Abs: 3.1 10*3/uL (ref 1.7–7.7)
Neutrophils Relative %: 48 %
Platelet Count: 363 10*3/uL (ref 150–400)
RBC: 5.45 MIL/uL (ref 4.22–5.81)
RDW: 12 % (ref 11.5–15.5)
WBC Count: 6.3 10*3/uL (ref 4.0–10.5)
nRBC: 0 % (ref 0.0–0.2)

## 2021-12-23 LAB — CMP (CANCER CENTER ONLY)
ALT: 42 U/L (ref 0–44)
AST: 21 U/L (ref 15–41)
Albumin: 5 g/dL (ref 3.5–5.0)
Alkaline Phosphatase: 78 U/L (ref 38–126)
Anion gap: 7 (ref 5–15)
BUN: 16 mg/dL (ref 6–20)
CO2: 28 mmol/L (ref 22–32)
Calcium: 9.9 mg/dL (ref 8.9–10.3)
Chloride: 105 mmol/L (ref 98–111)
Creatinine: 1.01 mg/dL (ref 0.61–1.24)
GFR, Estimated: >60 mL/min (ref >60)
Glucose, Bld: 104 mg/dL — ABNORMAL HIGH (ref 70–99)
Potassium: 4.6 mmol/L (ref 3.5–5.1)
Sodium: 140 mmol/L (ref 135–145)
Total Bilirubin: 0.8 mg/dL (ref 0.3–1.2)
Total Protein: 7.9 g/dL (ref 6.5–8.1)

## 2021-12-24 ENCOUNTER — Telehealth: Payer: Self-pay | Admitting: Hematology and Oncology

## 2021-12-24 NOTE — Telephone Encounter (Signed)
Per 5/15 los called and spoke to pt about appointment in aug.  Pt confirmed appointments  ?

## 2021-12-25 LAB — BETA-2-GLYCOPROTEIN I ABS, IGG/M/A
Beta-2 Glyco I IgG: <9 GPI IgG units (ref 0–20)
Beta-2-Glycoprotein I IgA: <9 GPI IgA units (ref 0–25)
Beta-2-Glycoprotein I IgM: <9 GPI IgM units (ref 0–32)

## 2021-12-25 LAB — CARDIOLIPIN ANTIBODIES, IGG, IGM, IGA
Anticardiolipin IgA: <9 APL U/mL (ref 0–11)
Anticardiolipin IgG: <9 GPL U/mL (ref 0–14)
Anticardiolipin IgM: <9 MPL U/mL (ref 0–12)

## 2021-12-27 LAB — FACTOR 5 LEIDEN

## 2021-12-30 LAB — PROTHROMBIN GENE MUTATION

## 2022-01-07 ENCOUNTER — Telehealth: Payer: Self-pay | Admitting: *Deleted

## 2022-01-07 NOTE — Telephone Encounter (Signed)
Notified of message below

## 2022-01-07 NOTE — Telephone Encounter (Signed)
-----   Message from Kyra Searles, RN sent at 01/03/2022  4:38 PM EDT -----  ----- Message ----- From: Jaci Standard, MD Sent: 12/31/2021   4:15 PM EDT To: Kyra Searles, RN  Please let Mr. 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 He has 1 bad copy of the gene ( and 1 good copy). This makes him more prone to developing blood clots. This does not change our plan. He will need to be on blood thinner indefinitely. We will see him back in August 2023.  ----- Message ----- From: Leory Plowman, [facility] [place] Sent: 12/23/2021  10:19 AM EDT To: Jaci Standard, MD

## 2022-01-20 DIAGNOSIS — H6121 Impacted cerumen, right ear: Secondary | ICD-10-CM | POA: Diagnosis not present

## 2022-01-22 ENCOUNTER — Other Ambulatory Visit: Payer: Self-pay | Admitting: Family

## 2022-01-22 DIAGNOSIS — E785 Hyperlipidemia, unspecified: Secondary | ICD-10-CM

## 2022-01-22 NOTE — Telephone Encounter (Signed)
Refilled per patient request. 

## 2022-04-07 ENCOUNTER — Inpatient Hospital Stay: Payer: BC Managed Care – PPO

## 2022-04-07 ENCOUNTER — Inpatient Hospital Stay: Payer: BC Managed Care – PPO | Attending: Hematology and Oncology | Admitting: Hematology and Oncology

## 2022-04-07 ENCOUNTER — Other Ambulatory Visit: Payer: Self-pay

## 2022-04-07 ENCOUNTER — Other Ambulatory Visit: Payer: Self-pay | Admitting: Hematology and Oncology

## 2022-04-07 VITALS — BP 133/73 | HR 61 | Temp 97.6°F | Resp 20 | Wt 209.8 lb

## 2022-04-07 DIAGNOSIS — D6851 Activated protein C resistance: Secondary | ICD-10-CM | POA: Diagnosis not present

## 2022-04-07 DIAGNOSIS — I82441 Acute embolism and thrombosis of right tibial vein: Secondary | ICD-10-CM | POA: Insufficient documentation

## 2022-04-07 DIAGNOSIS — Z87891 Personal history of nicotine dependence: Secondary | ICD-10-CM | POA: Insufficient documentation

## 2022-04-07 DIAGNOSIS — I82433 Acute embolism and thrombosis of popliteal vein, bilateral: Secondary | ICD-10-CM | POA: Diagnosis not present

## 2022-04-07 DIAGNOSIS — Z832 Family history of diseases of the blood and blood-forming organs and certain disorders involving the immune mechanism: Secondary | ICD-10-CM | POA: Diagnosis not present

## 2022-04-07 DIAGNOSIS — I82461 Acute embolism and thrombosis of right calf muscular vein: Secondary | ICD-10-CM | POA: Diagnosis not present

## 2022-04-07 DIAGNOSIS — I82451 Acute embolism and thrombosis of right peroneal vein: Secondary | ICD-10-CM | POA: Insufficient documentation

## 2022-04-07 DIAGNOSIS — Z7901 Long term (current) use of anticoagulants: Secondary | ICD-10-CM | POA: Insufficient documentation

## 2022-04-07 LAB — CMP (CANCER CENTER ONLY)
ALT: 39 U/L (ref 0–44)
AST: 21 U/L (ref 15–41)
Albumin: 4.7 g/dL (ref 3.5–5.0)
Alkaline Phosphatase: 78 U/L (ref 38–126)
Anion gap: 5 (ref 5–15)
BUN: 13 mg/dL (ref 6–20)
CO2: 28 mmol/L (ref 22–32)
Calcium: 9.7 mg/dL (ref 8.9–10.3)
Chloride: 107 mmol/L (ref 98–111)
Creatinine: 0.9 mg/dL (ref 0.61–1.24)
GFR, Estimated: >60 mL/min (ref >60)
Glucose, Bld: 111 mg/dL — ABNORMAL HIGH (ref 70–99)
Potassium: 4.3 mmol/L (ref 3.5–5.1)
Sodium: 140 mmol/L (ref 135–145)
Total Bilirubin: 0.7 mg/dL (ref 0.3–1.2)
Total Protein: 7.2 g/dL (ref 6.5–8.1)

## 2022-04-07 LAB — CBC WITH DIFFERENTIAL (CANCER CENTER ONLY)
Abs Immature Granulocytes: 0.01 10*3/uL (ref 0.00–0.07)
Basophils Absolute: 0 10*3/uL (ref 0.0–0.1)
Basophils Relative: 1 %
Eosinophils Absolute: 0.3 10*3/uL (ref 0.0–0.5)
Eosinophils Relative: 6 %
HCT: 44.3 % (ref 39.0–52.0)
Hemoglobin: 15.4 g/dL (ref 13.0–17.0)
Immature Granulocytes: 0 %
Lymphocytes Relative: 38 %
Lymphs Abs: 2.2 10*3/uL (ref 0.7–4.0)
MCH: 30.1 pg (ref 26.0–34.0)
MCHC: 34.8 g/dL (ref 30.0–36.0)
MCV: 86.5 fL (ref 80.0–100.0)
Monocytes Absolute: 0.5 10*3/uL (ref 0.1–1.0)
Monocytes Relative: 9 %
Neutro Abs: 2.7 10*3/uL (ref 1.7–7.7)
Neutrophils Relative %: 46 %
Platelet Count: 340 10*3/uL (ref 150–400)
RBC: 5.12 MIL/uL (ref 4.22–5.81)
RDW: 11.8 % (ref 11.5–15.5)
WBC Count: 5.9 10*3/uL (ref 4.0–10.5)
nRBC: 0 % (ref 0.0–0.2)

## 2022-04-07 MED ORDER — RIVAROXABAN 10 MG PO TABS: 10.0000 mg | ORAL_TABLET | Freq: Every day | ORAL | 3 refills | Status: DC

## 2022-04-07 NOTE — Progress Notes (Signed)
[place] [phone]   Fax:([phone]  PROGRESS NOTE  Patient Care Team: Camillia Herter, NP as PCP - General (Nurse Practitioner)  Hematological/Oncological History # Family History of Factor V Leiden # Unprovoked Right Lower Extremity DVT  10/06/2021: Patient underwent a right lower extremity ultrasound which showed an acute deep vein thrombosis involving the right  popliteal vein, right posterior tibial veins, and right peroneal veins.  Same-day CT PE study showed no evidence of pulmonary embolism. 12/22/2021: Establish care with Dr. Lorenso Courier  Interval History:  Gabriel Burgess 41 y.o. 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 The patient's last visit was on 12/22/2021 at which time he established care. In the interim since the last visit he has continued on Xarelto therapy as prescribed  On exam today Gabriel Burgess has been tolerating Xarelto therapy well without any bleeding or bruising.  He thinks that it may take longer for him to stop bleeding from minor scrapes.  He notes that he rarely misses a dose of his Xarelto.  He notes the cost of the medication is reasonable if he is well $30 per month.  He notes he is not having any issues with leg swelling, leg pain, shortness of breath, or chest pain.  He notes he does occasionally feel some tightness in his leg from the knee down.  He notes that he does have some occasional heartburn but otherwise has been quite well.  He denies any fevers, chills, sweats, nausea vomiting or diarrhea.  MEDICAL HISTORY:  Past Medical History:  Diagnosis Date   DVT (deep venous thrombosis) (HCC)    right leg    SURGICAL HISTORY: No past surgical history on file.  SOCIAL HISTORY: Social History   Socioeconomic History   Marital status: Married    Spouse name: Not on file   Number of children: Not on file    Years of education: Not on file   Highest education level: Not on file  Occupational History   Not on file  Tobacco Use   Smoking status: Former    Packs/day: 0.50    Types: Cigarettes    Passive exposure: Past   Smokeless tobacco: Never  Vaping Use   Vaping Use: Never used  Substance and Sexual Activity   Alcohol use: Yes    Comment: occassionally   Drug use: Never   Sexual activity: Yes    Partners: Female  Other Topics Concern   Not on file  Social History Narrative   Not on file   Social Determinants of Health   Financial Resource Strain: Not on file  Food Insecurity: Not on file  Transportation Needs: Not on file  Physical Activity: Not on file  Stress: Not on file  Social Connections: Not on file  Intimate Partner Violence: Not on file    FAMILY HISTORY: No family history on file.  ALLERGIES:  is allergic to amoxicillin.  MEDICATIONS:  Current Outpatient Medications  Medication Sig Dispense Refill   atorvastatin (LIPITOR) 20 MG tablet TAKE 1 TABLET BY MOUTH EVERY DAY 90 tablet 1   rivaroxaban (XARELTO) 10 MG TABS tablet Take 1 tablet (10 mg total) by mouth daily. 90 tablet 3   No current facility-administered medications for this visit.    REVIEW OF SYSTEMS:   Constitutional: ( - ) fevers, ( - )  chills , ( - ) night sweats Eyes: ( - )  blurriness of vision, ( - ) double vision, ( - ) watery eyes Ears, nose, mouth, throat, and face: ( - ) mucositis, ( - ) sore throat Respiratory: ( - ) cough, ( - ) dyspnea, ( - ) wheezes Cardiovascular: ( - ) palpitation, ( - ) chest discomfort, ( - ) lower extremity swelling Gastrointestinal:  ( - ) nausea, ( - ) heartburn, ( - ) change in bowel habits Skin: ( - ) abnormal skin rashes Lymphatics: ( - ) new lymphadenopathy, ( - ) easy bruising Neurological: ( - ) numbness, ( - ) tingling, ( - ) new weaknesses Behavioral/Psych: ( - ) mood change, ( - ) new changes  All other systems were reviewed with the patient and are  negative.  PHYSICAL EXAMINATION:  Vitals:   04/07/22 0959  BP: 133/73  Pulse: 61  Resp: 20  Temp: 97.6 F (36.4 C)  SpO2: 100%   Filed Weights   04/07/22 0959  Weight: 209 lb 12.8 oz (95.2 kg)    GENERAL: Well-appearing middle-age Caucasian male, alert, no distress and comfortable SKIN: skin color, texture, turgor are normal, no rashes or significant lesions EYES: conjunctiva are pink and non-injected, sclera clear LUNGS: clear to auscultation and percussion with normal breathing effort HEART: regular rate & rhythm and no murmurs and no lower extremity edema Musculoskeletal: no cyanosis of digits and no clubbing  PSYCH: alert & oriented x 3, fluent speech NEURO: no focal motor/sensory deficits  LABORATORY DATA:  I have reviewed the data as listed    Latest Ref Rng & Units 04/07/2022    9:43 AM 12/23/2021    9:55 AM 10/06/2021    9:12 AM  CBC  WBC 4.0 - 10.5 K/uL 5.9  6.3  9.6   Hemoglobin 13.0 - 17.0 g/dL 50.0  93.8  18.2   Hematocrit 39.0 - 52.0 % 44.3  47.3  44.8   Platelets 150 - 400 K/uL 340  363  321        Latest Ref Rng & Units 04/07/2022    9:43 AM 12/23/2021    9:55 AM 10/31/2021   12:00 AM  CMP  Glucose 70 - 99 mg/dL 993  716    BUN 6 - 20 mg/dL 13  16    Creatinine 9.67 - 1.24 mg/dL 8.93  8.10    Sodium 175 - 145 mmol/L 140  140    Potassium 3.5 - 5.1 mmol/L 4.3  4.6    Chloride 98 - 111 mmol/L 107  105    CO2 22 - 32 mmol/L 28  28    Calcium 8.9 - 10.3 mg/dL 9.7  9.9    Total Protein 6.5 - 8.1 g/dL 7.2  7.9  7.8   Total Bilirubin 0.3 - 1.2 mg/dL 0.7  0.8  0.8   Alkaline Phos 38 - 126 U/L 78  78  104   AST 15 - 41 U/L 21  21  18    ALT 0 - 44 U/L 39  42  31     RADIOGRAPHIC STUDIES: No results found.  ASSESSMENT & PLAN Gabriel Burgess 41 y.o. 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   After review of the labs, review of the records, and  discussion with the patient the patients findings are most consistent with an unprovoked right lower extremity DVT.   A provoked venous thromboembolism (VTE) is one that has  a clear inciting factor or event. Provoking factors include prolonged travel/immobility, surgery (particular abdominal or orthropedic), trauma,  and pregnancy/ estrogen containing birth control. After a detailed history and review of the records there is no clear provoking factor for this patient's VTE.  Patients with unprovoked VTEs have up to 25% recurrence after 5 years and 36% at 10 years, with 4% of these clots being fatal (BMJ?2019;366:l4363). Therefore the formal recommendation for unprovoked VTE's is lifelong anticoagulation, as the cause may not be transient or reversible. We recommend 6 months or full strength anticoagulation with a re-evaluation after that time.  The patient's will then have a choice of maintenance dose DOAC (preferred, recommended), 81mg  ASA PO daily (non-preferred), or no further anticoagulation (not recommended).     # Unprovoked DVT/Pulmonary Embolism  --findings at this time are consistent with a unprovoked VTE  -- At each visit will order CMP and CBC to assure labs are adequate for DOAC therapy  --ruled out APS with anticardiolipin and anti beta2 glycoprotein antibodies.  Lupus anticoagulant panel would be altered by presence of blood thinner, will hold on this testing.   --patient denies any bleeding, bruising, or dark stools on this medication. It is well tolerated. No difficulties accessing/affording the medication  --recommend the patient continue xarelto. --Discussed maintenance dosing with the patient today.  He is agreeable to dropping down to 10 mg p.o. daily.  Prescription ordered today. --RTC in 6 months' time with strict return precautions for overt signs of bleeding.     # Heterozygous for Factor V Leiden #  Family History of Factor V Leiden -- Patient found to be heterozygous for  factor V Leiden. -- Patient has a son who is 2 years old.  At this time there is no indication for genetic testing of his child.  Patient notes that his wife is of [place] descent.  No orders of the defined types were placed in this encounter.   All questions were answered. The patient knows to call the clinic with any problems, questions or concerns.  A total of more than 30 minutes were spent on this encounter with face-to-face time and non-face-to-face time, including preparing to see the patient, ordering tests and/or medications, counseling the patient and coordination of care as outlined above.   [place], MD Department of Hematology/Oncology [facility] Phone: [phone] Pager: [phone] Email: [email or web address]  04/08/2022 9:16 AM

## 2022-04-28 ENCOUNTER — Encounter: Payer: Self-pay | Admitting: Hematology and Oncology

## 2022-04-28 ENCOUNTER — Other Ambulatory Visit: Payer: Self-pay | Admitting: *Deleted

## 2022-04-28 MED ORDER — RIVAROXABAN 10 MG PO TABS: 10.0000 mg | ORAL_TABLET | Freq: Every day | ORAL | 3 refills | Status: DC

## 2022-07-28 ENCOUNTER — Other Ambulatory Visit: Payer: Self-pay | Admitting: Family

## 2022-07-28 DIAGNOSIS — E785 Hyperlipidemia, unspecified: Secondary | ICD-10-CM

## 2022-09-25 DIAGNOSIS — M25551 Pain in right hip: Secondary | ICD-10-CM | POA: Diagnosis not present

## 2022-09-25 DIAGNOSIS — M9905 Segmental and somatic dysfunction of pelvic region: Secondary | ICD-10-CM | POA: Diagnosis not present

## 2022-09-25 DIAGNOSIS — M5116 Intervertebral disc disorders with radiculopathy, lumbar region: Secondary | ICD-10-CM | POA: Diagnosis not present

## 2022-09-25 DIAGNOSIS — M9903 Segmental and somatic dysfunction of lumbar region: Secondary | ICD-10-CM | POA: Diagnosis not present

## 2022-09-26 DIAGNOSIS — S39012A Strain of muscle, fascia and tendon of lower back, initial encounter: Secondary | ICD-10-CM | POA: Diagnosis not present

## 2022-09-29 ENCOUNTER — Encounter: Payer: Self-pay | Admitting: Hematology and Oncology

## 2022-09-30 DIAGNOSIS — M9905 Segmental and somatic dysfunction of pelvic region: Secondary | ICD-10-CM | POA: Diagnosis not present

## 2022-09-30 DIAGNOSIS — M25551 Pain in right hip: Secondary | ICD-10-CM | POA: Diagnosis not present

## 2022-09-30 DIAGNOSIS — M9903 Segmental and somatic dysfunction of lumbar region: Secondary | ICD-10-CM | POA: Diagnosis not present

## 2022-09-30 DIAGNOSIS — M5116 Intervertebral disc disorders with radiculopathy, lumbar region: Secondary | ICD-10-CM | POA: Diagnosis not present

## 2022-10-02 DIAGNOSIS — M25551 Pain in right hip: Secondary | ICD-10-CM | POA: Diagnosis not present

## 2022-10-02 DIAGNOSIS — M9905 Segmental and somatic dysfunction of pelvic region: Secondary | ICD-10-CM | POA: Diagnosis not present

## 2022-10-02 DIAGNOSIS — M5116 Intervertebral disc disorders with radiculopathy, lumbar region: Secondary | ICD-10-CM | POA: Diagnosis not present

## 2022-10-02 DIAGNOSIS — M9903 Segmental and somatic dysfunction of lumbar region: Secondary | ICD-10-CM | POA: Diagnosis not present

## 2022-10-06 ENCOUNTER — Inpatient Hospital Stay: Payer: BC Managed Care – PPO

## 2022-10-06 ENCOUNTER — Other Ambulatory Visit: Payer: Self-pay | Admitting: Hematology and Oncology

## 2022-10-06 ENCOUNTER — Inpatient Hospital Stay: Payer: BC Managed Care – PPO | Attending: Hematology and Oncology | Admitting: Hematology and Oncology

## 2022-10-06 VITALS — BP 139/74 | HR 67 | Temp 97.9°F | Resp 13 | Wt 214.2 lb

## 2022-10-06 DIAGNOSIS — I82461 Acute embolism and thrombosis of right calf muscular vein: Secondary | ICD-10-CM | POA: Diagnosis not present

## 2022-10-06 DIAGNOSIS — Z832 Family history of diseases of the blood and blood-forming organs and certain disorders involving the immune mechanism: Secondary | ICD-10-CM | POA: Diagnosis not present

## 2022-10-06 DIAGNOSIS — Z86711 Personal history of pulmonary embolism: Secondary | ICD-10-CM | POA: Diagnosis not present

## 2022-10-06 DIAGNOSIS — Z7901 Long term (current) use of anticoagulants: Secondary | ICD-10-CM | POA: Diagnosis not present

## 2022-10-06 DIAGNOSIS — Z86718 Personal history of other venous thrombosis and embolism: Secondary | ICD-10-CM | POA: Diagnosis not present

## 2022-10-06 LAB — CMP (CANCER CENTER ONLY)
ALT: 36 U/L (ref 0–44)
AST: 17 U/L (ref 15–41)
Albumin: 4.7 g/dL (ref 3.5–5.0)
Alkaline Phosphatase: 77 U/L (ref 38–126)
Anion gap: 7 (ref 5–15)
BUN: 14 mg/dL (ref 6–20)
CO2: 27 mmol/L (ref 22–32)
Calcium: 9.2 mg/dL (ref 8.9–10.3)
Chloride: 104 mmol/L (ref 98–111)
Creatinine: 0.96 mg/dL (ref 0.61–1.24)
GFR, Estimated: >60 mL/min
Glucose, Bld: 97 mg/dL (ref 70–99)
Potassium: 4.2 mmol/L (ref 3.5–5.1)
Sodium: 138 mmol/L (ref 135–145)
Total Bilirubin: 0.6 mg/dL (ref 0.3–1.2)
Total Protein: 7.5 g/dL (ref 6.5–8.1)

## 2022-10-06 LAB — CBC WITH DIFFERENTIAL (CANCER CENTER ONLY)
Abs Immature Granulocytes: 0.03 10*3/uL (ref 0.00–0.07)
Basophils Absolute: 0.1 10*3/uL (ref 0.0–0.1)
Basophils Relative: 1 %
Eosinophils Absolute: 0.4 10*3/uL (ref 0.0–0.5)
Eosinophils Relative: 5 %
HCT: 45.8 % (ref 39.0–52.0)
Hemoglobin: 15.5 g/dL (ref 13.0–17.0)
Immature Granulocytes: 0 %
Lymphocytes Relative: 37 %
Lymphs Abs: 2.8 10*3/uL (ref 0.7–4.0)
MCH: 30 pg (ref 26.0–34.0)
MCHC: 33.8 g/dL (ref 30.0–36.0)
MCV: 88.8 fL (ref 80.0–100.0)
Monocytes Absolute: 0.7 10*3/uL (ref 0.1–1.0)
Monocytes Relative: 9 %
Neutro Abs: 3.6 10*3/uL (ref 1.7–7.7)
Neutrophils Relative %: 48 %
Platelet Count: 394 10*3/uL (ref 150–400)
RBC: 5.16 MIL/uL (ref 4.22–5.81)
RDW: 11.9 % (ref 11.5–15.5)
WBC Count: 7.5 10*3/uL (ref 4.0–10.5)
nRBC: 0 % (ref 0.0–0.2)

## 2022-10-06 NOTE — Progress Notes (Signed)
[place] [phone]   [phone]  PROGRESS NOTE  Patient Care Team: Camillia Herter, NP as PCP - General (Nurse Practitioner)  Hematological/Oncological History # Family History of Factor V Leiden # Unprovoked Right Lower Extremity DVT  10/06/2021: Patient underwent a right lower extremity ultrasound which showed an acute deep vein thrombosis involving the right  popliteal vein, right posterior tibial veins, and right peroneal veins.  Same-day CT PE study showed no evidence of pulmonary embolism. 12/22/2021: Establish care with Dr. Lorenso Courier  Interval History:  Gabriel Burgess 42 y.o. 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 The patient's last visit was on 04/07/2022.  In the interim since the last visit he has continued on Xarelto therapy as prescribed  On exam today Gabriel Burgess reports he has been well in the interim since her last visit.  He is had no major changes in his health.  He has been having some back pain and he was trying to take diclofenac but was concerned due to the additional anticoagulation properties of this medication.  He notes that he was taking prednisone as well as muscle relaxers but is stopped all these at this point.  He is undergoing decompression therapy with chiropractor.  He reports that he continues his Xarelto 10 mg p.o. daily and rarely misses a dose.  He is not having any issues with bleeding, bruising, or dark stools.  He reports the medication remains about $1 per day.  He notes he is not having any signs or symptoms concerning for recurrent VTE such as leg pain, leg swelling, chest pain, or shortness of breath.  He reports he does have some occasional muscle cramps in his calf but they resolved.  He notes that he is eating well.  He has no upcoming planned dental work or surgical procedures.  He denies any fevers,  chills, sweats, nausea vomiting or diarrhea. A full 10 point ROS was otherwise negative.   MEDICAL HISTORY:  Past Medical History:  Diagnosis Date   DVT (deep venous thrombosis) (HCC)    right leg    SURGICAL HISTORY: No past surgical history on file.  SOCIAL HISTORY: Social History   Socioeconomic History   Marital status: Married    Spouse name: Not on file   Number of children: Not on file   Years of education: Not on file   Highest education level: Not on file  Occupational History   Not on file  Tobacco Use   Smoking status: Former    Packs/day: 0.50    Types: Cigarettes    Passive exposure: Past   Smokeless tobacco: Never  Vaping Use   Vaping Use: Never used  Substance and Sexual Activity   Alcohol use: Yes    Comment: occassionally   Drug use: Never   Sexual activity: Yes    Partners: Female  Other Topics Concern   Not on file  Social History Narrative   Not on file   Social Determinants of Health   Financial Resource Strain: Not on file  Food Insecurity: Not on file  Transportation Needs: Not on file  Physical Activity: Not on file  Stress: Not on file  Social Connections: Not on file  Intimate Partner Violence: Not on file    FAMILY HISTORY: No family history on file.  ALLERGIES:  is allergic to amoxicillin.  MEDICATIONS:  Current Outpatient Medications  Medication  Sig Dispense Refill   atorvastatin (LIPITOR) 20 MG tablet TAKE 1 TABLET BY MOUTH EVERY DAY 90 tablet 1   rivaroxaban (XARELTO) 10 MG TABS tablet Take 1 tablet (10 mg total) by mouth daily. 90 tablet 3   No current facility-administered medications for this visit.    REVIEW OF SYSTEMS:   Constitutional: ( - ) fevers, ( - )  chills , ( - ) night sweats Eyes: ( - ) blurriness of vision, ( - ) double vision, ( - ) watery eyes Ears, nose, mouth, throat, and face: ( - ) mucositis, ( - ) sore throat Respiratory: ( - ) cough, ( - ) dyspnea, ( - ) wheezes Cardiovascular: ( - )  palpitation, ( - ) chest discomfort, ( - ) lower extremity swelling Gastrointestinal:  ( - ) nausea, ( - ) heartburn, ( - ) change in bowel habits Skin: ( - ) abnormal skin rashes Lymphatics: ( - ) new lymphadenopathy, ( - ) easy bruising Neurological: ( - ) numbness, ( - ) tingling, ( - ) new weaknesses Behavioral/Psych: ( - ) mood change, ( - ) new changes  All other systems were reviewed with the patient and are negative.  PHYSICAL EXAMINATION:  Vitals:   10/06/22 0835  BP: 139/74  Pulse: 67  Resp: 13  Temp: 97.9 F (36.6 C)  SpO2: 100%   Filed Weights   10/06/22 0835  Weight: 214 lb 3.2 oz (97.2 kg)    GENERAL: Well-appearing middle-age Caucasian male, alert, no distress and comfortable SKIN: skin color, texture, turgor are normal, no rashes or significant lesions EYES: conjunctiva are pink and non-injected, sclera clear LUNGS: clear to auscultation and percussion with normal breathing effort HEART: regular rate & rhythm and no murmurs and no lower extremity edema Musculoskeletal: no cyanosis of digits and no clubbing  PSYCH: alert & oriented x 3, fluent speech NEURO: no focal motor/sensory deficits  LABORATORY DATA:  I have reviewed the data as listed    Latest Ref Rng & Units 10/06/2022    7:49 AM 04/07/2022    9:43 AM 12/23/2021    9:55 AM  CBC  WBC 4.0 - 10.5 K/uL 7.5  5.9  6.3   Hemoglobin 13.0 - 17.0 g/dL 15.5  15.4  16.3   Hematocrit 39.0 - 52.0 % 45.8  44.3  47.3   Platelets 150 - 400 K/uL 394  340  363        Latest Ref Rng & Units 10/06/2022    7:49 AM 04/07/2022    9:43 AM 12/23/2021    9:55 AM  CMP  Glucose 70 - 99 mg/dL 97  111  104   BUN 6 - 20 mg/dL '14  13  16   '$ Creatinine 0.61 - 1.24 mg/dL 0.96  0.90  1.01   Sodium 135 - 145 mmol/L 138  140  140   Potassium 3.5 - 5.1 mmol/L 4.2  4.3  4.6   Chloride 98 - 111 mmol/L 104  107  105   CO2 22 - 32 mmol/L '27  28  28   '$ Calcium 8.9 - 10.3 mg/dL 9.2  9.7  9.9   Total Protein 6.5 - 8.1 g/dL 7.5  7.2   7.9   Total Bilirubin 0.3 - 1.2 mg/dL 0.6  0.7  0.8   Alkaline Phos 38 - 126 U/L 77  78  78   AST 15 - 41 U/L '17  21  21   '$ ALT 0 - 44 U/L  36  39  42     RADIOGRAPHIC STUDIES: No results found.  ASSESSMENT & PLAN Gabriel Burgess 42 y.o. 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   After review of the labs, review of the records, and discussion with the patient the patients findings are most consistent with an unprovoked right lower extremity DVT.   A provoked venous thromboembolism (VTE) is one that has a clear inciting factor or event. Provoking factors include prolonged travel/immobility, surgery (particular abdominal or orthropedic), trauma,  and pregnancy/ estrogen containing birth control. After a detailed history and review of the records there is no clear provoking factor for this patient's VTE.  Patients with unprovoked VTEs have up to 25% recurrence after 5 years and 36% at 10 years, with 4% of these clots being fatal (BMJ?2019;366:l4363). Therefore the formal recommendation for unprovoked VTE's is lifelong anticoagulation, as the cause may not be transient or reversible. We recommend 6 months or full strength anticoagulation with a re-evaluation after that time.  The patient's will then have a choice of maintenance dose DOAC (preferred, recommended), '81mg'$  ASA PO daily (non-preferred), or no further anticoagulation (not recommended).     # Unprovoked DVT/Pulmonary Embolism  --findings at this time are consistent with a unprovoked VTE  -- At each visit will order CMP and CBC to assure labs are adequate for DOAC therapy  --ruled out APS with anticardiolipin and anti beta2 glycoprotein antibodies.  Lupus anticoagulant panel would be altered by presence of blood thinner, will hold on this testing.   --patient denies any bleeding, bruising, or dark stools on this medication. It is well  tolerated. No difficulties accessing/affording the medication  --recommend the patient continue xarelto, currently on maintenance dose 10 mg PO daily.  --Discussed maintenance dosing with the patient previously.  He was agreeable to dropping down to 10 mg p.o. daily.  Prescription ordered today. -- labs today show WBC 7.5, Hgb 15.5, MCV 88.8 --RTC in 6 months' time with strict return precautions for overt signs of bleeding.     # Heterozygous for Factor V Leiden #  Family History of Factor V Leiden -- Patient found to be heterozygous for factor V Leiden. -- Patient has a son who is 27 years old.  At this time there is no indication for genetic testing of his child.  Patient notes that his wife is of [place] descent (low risk of being a FVL carrier).  No orders of the defined types were placed in this encounter.   All questions were answered. The patient knows to call the clinic with any problems, questions or concerns.  A total of more than 25 minutes were spent on this encounter with face-to-face time and non-face-to-face time, including preparing to see the patient, ordering tests and/or medications, counseling the patient and coordination of care as outlined above.   Ledell Peoples, MD Department of Hematology/Oncology [place] at [facility] Phone: [phone] Pager: [phone] Email: [email or web address]  10/06/2022 9:15 AM

## 2022-10-07 DIAGNOSIS — M9903 Segmental and somatic dysfunction of lumbar region: Secondary | ICD-10-CM | POA: Diagnosis not present

## 2022-10-07 DIAGNOSIS — M9905 Segmental and somatic dysfunction of pelvic region: Secondary | ICD-10-CM | POA: Diagnosis not present

## 2022-10-07 DIAGNOSIS — M25551 Pain in right hip: Secondary | ICD-10-CM | POA: Diagnosis not present

## 2022-10-07 DIAGNOSIS — M5116 Intervertebral disc disorders with radiculopathy, lumbar region: Secondary | ICD-10-CM | POA: Diagnosis not present

## 2022-10-08 DIAGNOSIS — M5116 Intervertebral disc disorders with radiculopathy, lumbar region: Secondary | ICD-10-CM | POA: Diagnosis not present

## 2022-10-08 DIAGNOSIS — M9905 Segmental and somatic dysfunction of pelvic region: Secondary | ICD-10-CM | POA: Diagnosis not present

## 2022-10-08 DIAGNOSIS — M9903 Segmental and somatic dysfunction of lumbar region: Secondary | ICD-10-CM | POA: Diagnosis not present

## 2022-10-08 DIAGNOSIS — M25551 Pain in right hip: Secondary | ICD-10-CM | POA: Diagnosis not present

## 2022-10-09 DIAGNOSIS — M25551 Pain in right hip: Secondary | ICD-10-CM | POA: Diagnosis not present

## 2022-10-09 DIAGNOSIS — M9905 Segmental and somatic dysfunction of pelvic region: Secondary | ICD-10-CM | POA: Diagnosis not present

## 2022-10-09 DIAGNOSIS — M5116 Intervertebral disc disorders with radiculopathy, lumbar region: Secondary | ICD-10-CM | POA: Diagnosis not present

## 2022-10-09 DIAGNOSIS — M9903 Segmental and somatic dysfunction of lumbar region: Secondary | ICD-10-CM | POA: Diagnosis not present

## 2022-10-13 DIAGNOSIS — M25551 Pain in right hip: Secondary | ICD-10-CM | POA: Diagnosis not present

## 2022-10-13 DIAGNOSIS — M9903 Segmental and somatic dysfunction of lumbar region: Secondary | ICD-10-CM | POA: Diagnosis not present

## 2022-10-13 DIAGNOSIS — M9905 Segmental and somatic dysfunction of pelvic region: Secondary | ICD-10-CM | POA: Diagnosis not present

## 2022-10-13 DIAGNOSIS — M5116 Intervertebral disc disorders with radiculopathy, lumbar region: Secondary | ICD-10-CM | POA: Diagnosis not present

## 2022-10-14 DIAGNOSIS — M9905 Segmental and somatic dysfunction of pelvic region: Secondary | ICD-10-CM | POA: Diagnosis not present

## 2022-10-14 DIAGNOSIS — M25551 Pain in right hip: Secondary | ICD-10-CM | POA: Diagnosis not present

## 2022-10-14 DIAGNOSIS — M5116 Intervertebral disc disorders with radiculopathy, lumbar region: Secondary | ICD-10-CM | POA: Diagnosis not present

## 2022-10-14 DIAGNOSIS — M9903 Segmental and somatic dysfunction of lumbar region: Secondary | ICD-10-CM | POA: Diagnosis not present

## 2022-10-16 DIAGNOSIS — M5116 Intervertebral disc disorders with radiculopathy, lumbar region: Secondary | ICD-10-CM | POA: Diagnosis not present

## 2022-10-16 DIAGNOSIS — M9903 Segmental and somatic dysfunction of lumbar region: Secondary | ICD-10-CM | POA: Diagnosis not present

## 2022-10-16 DIAGNOSIS — M9905 Segmental and somatic dysfunction of pelvic region: Secondary | ICD-10-CM | POA: Diagnosis not present

## 2022-10-16 DIAGNOSIS — M25551 Pain in right hip: Secondary | ICD-10-CM | POA: Diagnosis not present

## 2022-10-20 DIAGNOSIS — M5116 Intervertebral disc disorders with radiculopathy, lumbar region: Secondary | ICD-10-CM | POA: Diagnosis not present

## 2022-10-20 DIAGNOSIS — M25551 Pain in right hip: Secondary | ICD-10-CM | POA: Diagnosis not present

## 2022-10-20 DIAGNOSIS — M9903 Segmental and somatic dysfunction of lumbar region: Secondary | ICD-10-CM | POA: Diagnosis not present

## 2022-10-20 DIAGNOSIS — M9905 Segmental and somatic dysfunction of pelvic region: Secondary | ICD-10-CM | POA: Diagnosis not present

## 2022-10-23 DIAGNOSIS — M9903 Segmental and somatic dysfunction of lumbar region: Secondary | ICD-10-CM | POA: Diagnosis not present

## 2022-10-23 DIAGNOSIS — M5116 Intervertebral disc disorders with radiculopathy, lumbar region: Secondary | ICD-10-CM | POA: Diagnosis not present

## 2022-10-23 DIAGNOSIS — M9905 Segmental and somatic dysfunction of pelvic region: Secondary | ICD-10-CM | POA: Diagnosis not present

## 2022-10-23 DIAGNOSIS — M25551 Pain in right hip: Secondary | ICD-10-CM | POA: Diagnosis not present

## 2022-10-27 DIAGNOSIS — M9905 Segmental and somatic dysfunction of pelvic region: Secondary | ICD-10-CM | POA: Diagnosis not present

## 2022-10-27 DIAGNOSIS — M9903 Segmental and somatic dysfunction of lumbar region: Secondary | ICD-10-CM | POA: Diagnosis not present

## 2022-10-27 DIAGNOSIS — M5116 Intervertebral disc disorders with radiculopathy, lumbar region: Secondary | ICD-10-CM | POA: Diagnosis not present

## 2022-10-27 DIAGNOSIS — M25551 Pain in right hip: Secondary | ICD-10-CM | POA: Diagnosis not present

## 2022-10-30 DIAGNOSIS — M5116 Intervertebral disc disorders with radiculopathy, lumbar region: Secondary | ICD-10-CM | POA: Diagnosis not present

## 2022-10-30 DIAGNOSIS — M9903 Segmental and somatic dysfunction of lumbar region: Secondary | ICD-10-CM | POA: Diagnosis not present

## 2022-10-30 DIAGNOSIS — M9905 Segmental and somatic dysfunction of pelvic region: Secondary | ICD-10-CM | POA: Diagnosis not present

## 2022-10-30 DIAGNOSIS — M25551 Pain in right hip: Secondary | ICD-10-CM | POA: Diagnosis not present

## 2022-11-04 DIAGNOSIS — M9905 Segmental and somatic dysfunction of pelvic region: Secondary | ICD-10-CM | POA: Diagnosis not present

## 2022-11-04 DIAGNOSIS — M9903 Segmental and somatic dysfunction of lumbar region: Secondary | ICD-10-CM | POA: Diagnosis not present

## 2022-11-04 DIAGNOSIS — M25551 Pain in right hip: Secondary | ICD-10-CM | POA: Diagnosis not present

## 2022-11-04 DIAGNOSIS — M5116 Intervertebral disc disorders with radiculopathy, lumbar region: Secondary | ICD-10-CM | POA: Diagnosis not present

## 2022-11-11 DIAGNOSIS — M5116 Intervertebral disc disorders with radiculopathy, lumbar region: Secondary | ICD-10-CM | POA: Diagnosis not present

## 2022-11-11 DIAGNOSIS — M9905 Segmental and somatic dysfunction of pelvic region: Secondary | ICD-10-CM | POA: Diagnosis not present

## 2022-11-11 DIAGNOSIS — M9903 Segmental and somatic dysfunction of lumbar region: Secondary | ICD-10-CM | POA: Diagnosis not present

## 2022-11-11 DIAGNOSIS — M25551 Pain in right hip: Secondary | ICD-10-CM | POA: Diagnosis not present

## 2022-11-19 DIAGNOSIS — M25551 Pain in right hip: Secondary | ICD-10-CM | POA: Diagnosis not present

## 2022-11-19 DIAGNOSIS — M9905 Segmental and somatic dysfunction of pelvic region: Secondary | ICD-10-CM | POA: Diagnosis not present

## 2022-11-19 DIAGNOSIS — M5116 Intervertebral disc disorders with radiculopathy, lumbar region: Secondary | ICD-10-CM | POA: Diagnosis not present

## 2022-11-19 DIAGNOSIS — M9903 Segmental and somatic dysfunction of lumbar region: Secondary | ICD-10-CM | POA: Diagnosis not present

## 2022-12-12 ENCOUNTER — Encounter: Payer: Self-pay | Admitting: Family Medicine

## 2022-12-12 ENCOUNTER — Ambulatory Visit: Payer: BC Managed Care – PPO | Admitting: Family Medicine

## 2022-12-12 VITALS — BP 122/76 | HR 64 | Temp 97.8°F | Ht 70.0 in | Wt 206.0 lb

## 2022-12-12 DIAGNOSIS — M545 Low back pain, unspecified: Secondary | ICD-10-CM | POA: Diagnosis not present

## 2022-12-12 DIAGNOSIS — G8929 Other chronic pain: Secondary | ICD-10-CM

## 2022-12-12 DIAGNOSIS — Z7901 Long term (current) use of anticoagulants: Secondary | ICD-10-CM | POA: Diagnosis not present

## 2022-12-12 DIAGNOSIS — E782 Mixed hyperlipidemia: Secondary | ICD-10-CM | POA: Diagnosis not present

## 2022-12-12 DIAGNOSIS — I82451 Acute embolism and thrombosis of right peroneal vein: Secondary | ICD-10-CM

## 2022-12-12 DIAGNOSIS — D6851 Activated protein C resistance: Secondary | ICD-10-CM | POA: Diagnosis not present

## 2022-12-12 DIAGNOSIS — Z7689 Persons encountering health services in other specified circumstances: Secondary | ICD-10-CM | POA: Diagnosis not present

## 2022-12-12 LAB — COMPREHENSIVE METABOLIC PANEL
ALT: 52 U/L (ref 0–53)
AST: 29 U/L (ref 0–37)
Albumin: 4.8 g/dL (ref 3.5–5.2)
Alkaline Phosphatase: 82 U/L (ref 39–117)
BUN: 15 mg/dL (ref 6–23)
CO2: 27 mEq/L (ref 19–32)
Calcium: 9.9 mg/dL (ref 8.4–10.5)
Chloride: 104 mEq/L (ref 96–112)
Creatinine, Ser: 0.94 mg/dL (ref 0.40–1.50)
GFR: 99.96 mL/min (ref >60.00)
Glucose, Bld: 99 mg/dL (ref 70–99)
Potassium: 4.1 mEq/L (ref 3.5–5.1)
Sodium: 141 mEq/L (ref 135–145)
Total Bilirubin: 0.9 mg/dL (ref 0.2–1.2)
Total Protein: 7.7 g/dL (ref 6.0–8.3)

## 2022-12-12 LAB — CBC WITH DIFFERENTIAL/PLATELET
Basophils Absolute: 0.1 10*3/uL (ref 0.0–0.1)
Basophils Relative: 0.7 % (ref 0.0–3.0)
Eosinophils Absolute: 0.3 10*3/uL (ref 0.0–0.7)
Eosinophils Relative: 4.2 % (ref 0.0–5.0)
HCT: 44.8 % (ref 39.0–52.0)
Hemoglobin: 15.2 g/dL (ref 13.0–17.0)
Lymphocytes Relative: 34.4 % (ref 12.0–46.0)
Lymphs Abs: 2.4 10*3/uL (ref 0.7–4.0)
MCHC: 34 g/dL (ref 30.0–36.0)
MCV: 89.1 fl (ref 78.0–100.0)
Monocytes Absolute: 0.5 10*3/uL (ref 0.1–1.0)
Monocytes Relative: 7.5 % (ref 3.0–12.0)
Neutro Abs: 3.8 10*3/uL (ref 1.4–7.7)
Neutrophils Relative %: 53.2 % (ref 43.0–77.0)
Platelets: 432 10*3/uL — ABNORMAL HIGH (ref 150.0–400.0)
RBC: 5.03 Mil/uL (ref 4.22–5.81)
RDW: 12.5 % (ref 11.5–15.5)
WBC: 7.1 10*3/uL (ref 4.0–10.5)

## 2022-12-12 LAB — LIPID PANEL
Cholesterol: 149 mg/dL (ref 0–200)
HDL: 41.2 mg/dL (ref >39.00)
LDL Cholesterol: 85 mg/dL (ref 0–99)
NonHDL: 108.05
Total CHOL/HDL Ratio: 4
Triglycerides: 113 mg/dL (ref 0.0–149.0)
VLDL: 22.6 mg/dL (ref 0.0–40.0)

## 2022-12-12 NOTE — Progress Notes (Signed)
Your labs are ok. Platelets (the blood cells responsible for clotting) are just marginally elevated. This is not concerning since it is so close to normal. I recommend monitoring this. Let's recheck in 3-4 months. Your cholesterol looks good. The medication is working. Let us know when you need refills.

## 2022-12-12 NOTE — Patient Instructions (Signed)
Thank you for trusting [place] with your health care.  Please go downstairs for labs before you leave.  I have included information regarding cholesterol and foods to avoid.  Follow-up as needed or for your annual preventive health care visit at your convenience.

## 2022-12-12 NOTE — Progress Notes (Signed)
New Patient Office Visit  Subjective    Patient ID: Gabriel Burgess, male    DOB: 1980-09-12  Age: 42 y.o. MRN: [id number]  CC:  Chief Complaint  Patient presents with   Establish Care    Lower back issues been going on since 29. Had an issue a couple months ago and is going to chiropractor so seeing if she has any new ideas. Lipitor refill.    HPI Gabriel Burgess presents to establish care  LDL 200 last year. He has been on statin therapy since this finding. Fasting today.   C/o intermittent low back pain x 10 years.  Doing compression and laser therapy with his chiropractor.   Hx of Factor V Leiden and recent DVT of lower leg.  On Xarelto managed by hematologist.   No smoking or drug use. Social alcohol.   Denies family hx of colon cancer.     Outpatient Encounter Medications as of 12/12/2022  Medication Sig   atorvastatin (LIPITOR) 20 MG tablet TAKE 1 TABLET BY MOUTH EVERY DAY   rivaroxaban (XARELTO) 10 MG TABS tablet Take 1 tablet (10 mg total) by mouth daily.   No facility-administered encounter medications on file as of 12/12/2022.    Past Medical History:  Diagnosis Date   Allergy    DVT (deep venous thrombosis) (HCC)    right leg    History reviewed. No pertinent surgical history.  Family History  Problem Relation Age of Onset   Hearing loss Mother    Anxiety disorder Father    Stroke Maternal Grandmother    Cancer Paternal Grandmother     Social History   Socioeconomic History   Marital status: Married    Spouse name: Not on file   Number of children: Not on file   Years of education: Not on file   Highest education level: Not on file  Occupational History   Not on file  Tobacco Use   Smoking status: Former    Packs/day: .5    Types: Cigarettes    Passive exposure: Past   Smokeless tobacco: Never  Vaping Use   Vaping Use: Never used  Substance and Sexual Activity   Alcohol use: Yes    Alcohol/week: 4.0 standard drinks of alcohol     Types: 2 Glasses of wine, 2 Standard drinks or equivalent per week    Comment: occassionally   Drug use: Not Currently   Sexual activity: Yes    Partners: Female    Birth control/protection: None  Other Topics Concern   Not on file  Social History Narrative   Not on file   Social Determinants of Health   Financial Resource Strain: Not on file  Food Insecurity: Not on file  Transportation Needs: Not on file  Physical Activity: Not on file  Stress: Not on file  Social Connections: Not on file  Intimate Partner Violence: Not on file    Review of Systems  Constitutional:  Negative for chills, fever, malaise/fatigue and weight loss.  Respiratory:  Negative for shortness of breath.   Cardiovascular:  Negative for chest pain and palpitations.  Gastrointestinal:  Negative for abdominal pain, constipation, diarrhea, nausea and vomiting.  Genitourinary:  Negative for dysuria, frequency and urgency.  Musculoskeletal:  Positive for back pain.  Neurological:  Negative for dizziness.        Objective    BP 122/76 (BP Location: Left Arm, Patient Position: Sitting, Cuff Size: Large)   Pulse 64   Temp 97.8 F (36.6 C) (  Temporal)   Ht 5\' 10"  (1.778 m)   Wt 206 lb (93.4 kg)   SpO2 97%   BMI 29.56 kg/m   Physical Exam Constitutional:      General: He is not in acute distress.    Appearance: He is not ill-appearing.  HENT:     Mouth/Throat:     Mouth: Mucous membranes are moist.  Eyes:     Extraocular Movements: Extraocular movements intact.     Conjunctiva/sclera: Conjunctivae normal.  Cardiovascular:     Rate and Rhythm: Normal rate and regular rhythm.  Pulmonary:     Effort: Pulmonary effort is normal.     Breath sounds: Normal breath sounds.  Musculoskeletal:     Cervical back: Normal range of motion and neck supple.  Skin:    General: Skin is warm and dry.  Neurological:     General: No focal deficit present.     Mental Status: He is alert and oriented to person,  place, and time.  Psychiatric:        Mood and Affect: Mood normal.        Behavior: Behavior normal.        Thought Content: Thought content normal.         Assessment & Plan:   Problem List Items Addressed This Visit       Cardiovascular and Mediastinum   Acute deep vein thrombosis (DVT) of right peroneal vein (HCC)     Hematopoietic and Hemostatic   Factor V Leiden (HCC)     Other   Chronic bilateral low back pain without sciatica   Hyperlipidemia - Primary   Relevant Orders   Lipid panel   Comprehensive metabolic panel   On anticoagulant therapy   Relevant Orders   CBC with Differential/Platelet   Comprehensive metabolic panel   Other Visit Diagnoses     Encounter to establish care          He is a pleasant 42 year old male who is new to the practice and here to establish care. His LDL was 200 over a year ago.  He has been on atorvastatin 20 mg daily since then.  He is fasting we will check his lipid panel today. We discussed a healthy diet and exercise. He is followed by hematology for DVT and on Xarelto.  This may be lifelong due to factor V Leiden. Check labs and follow-up.  Return for pending labs.   Hetty Blend, NP-C

## 2023-01-02 ENCOUNTER — Ambulatory Visit (INDEPENDENT_AMBULATORY_CARE_PROVIDER_SITE_OTHER): Payer: BC Managed Care – PPO | Admitting: Family Medicine

## 2023-01-02 ENCOUNTER — Encounter: Payer: Self-pay | Admitting: Family Medicine

## 2023-01-02 VITALS — BP 122/76 | HR 70 | Temp 97.8°F | Ht 70.0 in | Wt 208.0 lb

## 2023-01-02 DIAGNOSIS — R0683 Snoring: Secondary | ICD-10-CM | POA: Diagnosis not present

## 2023-01-02 DIAGNOSIS — G8929 Other chronic pain: Secondary | ICD-10-CM

## 2023-01-02 DIAGNOSIS — H9193 Unspecified hearing loss, bilateral: Secondary | ICD-10-CM | POA: Diagnosis not present

## 2023-01-02 DIAGNOSIS — Z0001 Encounter for general adult medical examination with abnormal findings: Secondary | ICD-10-CM

## 2023-01-02 DIAGNOSIS — R209 Unspecified disturbances of skin sensation: Secondary | ICD-10-CM

## 2023-01-02 DIAGNOSIS — M545 Low back pain, unspecified: Secondary | ICD-10-CM | POA: Diagnosis not present

## 2023-01-02 DIAGNOSIS — Z7901 Long term (current) use of anticoagulants: Secondary | ICD-10-CM

## 2023-01-02 DIAGNOSIS — R0681 Apnea, not elsewhere classified: Secondary | ICD-10-CM

## 2023-01-02 DIAGNOSIS — E785 Hyperlipidemia, unspecified: Secondary | ICD-10-CM

## 2023-01-02 DIAGNOSIS — D75839 Thrombocytosis, unspecified: Secondary | ICD-10-CM

## 2023-01-02 HISTORY — DX: Encounter for general adult medical examination with abnormal findings: Z00.01

## 2023-01-02 HISTORY — DX: Apnea, not elsewhere classified: R06.81

## 2023-01-02 LAB — CBC WITH DIFFERENTIAL/PLATELET
Basophils Absolute: 0 10*3/uL (ref 0.0–0.1)
Basophils Relative: 0.8 % (ref 0.0–3.0)
Eosinophils Absolute: 0.2 10*3/uL (ref 0.0–0.7)
Eosinophils Relative: 4 % (ref 0.0–5.0)
HCT: 46 % (ref 39.0–52.0)
Hemoglobin: 15.3 g/dL (ref 13.0–17.0)
Lymphocytes Relative: 34.6 % (ref 12.0–46.0)
Lymphs Abs: 2 10*3/uL (ref 0.7–4.0)
MCHC: 33.3 g/dL (ref 30.0–36.0)
MCV: 89.5 fl (ref 78.0–100.0)
Monocytes Absolute: 0.5 10*3/uL (ref 0.1–1.0)
Monocytes Relative: 8.4 % (ref 3.0–12.0)
Neutro Abs: 3.1 10*3/uL (ref 1.4–7.7)
Neutrophils Relative %: 52.2 % (ref 43.0–77.0)
Platelets: 354 10*3/uL (ref 150.0–400.0)
RBC: 5.14 Mil/uL (ref 4.22–5.81)
RDW: 12.5 % (ref 11.5–15.5)
WBC: 5.9 10*3/uL (ref 4.0–10.5)

## 2023-01-02 LAB — COMPREHENSIVE METABOLIC PANEL
ALT: 37 U/L (ref 0–53)
AST: 23 U/L (ref 0–37)
Albumin: 4.8 g/dL (ref 3.5–5.2)
Alkaline Phosphatase: 77 U/L (ref 39–117)
BUN: 12 mg/dL (ref 6–23)
CO2: 28 mEq/L (ref 19–32)
Calcium: 9.9 mg/dL (ref 8.4–10.5)
Chloride: 104 mEq/L (ref 96–112)
Creatinine, Ser: 0.91 mg/dL (ref 0.40–1.50)
GFR: 103.89 mL/min (ref >60.00)
Glucose, Bld: 98 mg/dL (ref 70–99)
Potassium: 4.3 mEq/L (ref 3.5–5.1)
Sodium: 142 mEq/L (ref 135–145)
Total Bilirubin: 0.7 mg/dL (ref 0.2–1.2)
Total Protein: 7.7 g/dL (ref 6.0–8.3)

## 2023-01-02 LAB — T4, FREE: Free T4: 0.75 ng/dL (ref 0.60–1.60)

## 2023-01-02 LAB — TSH: TSH: 2.46 u[IU]/mL (ref 0.35–5.50)

## 2023-01-02 LAB — VITAMIN B12: Vitamin B-12: 195 pg/mL — ABNORMAL LOW (ref 211–911)

## 2023-01-02 MED ORDER — ATORVASTATIN CALCIUM 20 MG PO TABS: 20.0000 mg | ORAL_TABLET | Freq: Every day | ORAL | 3 refills | Status: DC

## 2023-01-02 NOTE — Assessment & Plan Note (Signed)
He has been seeing chiropractor. Referral to orthopedist

## 2023-01-02 NOTE — Patient Instructions (Signed)
Please go downstairs for labs.   You should hear from [facility] to discuss home sleep test.   I referred you to an orthopedist and they will call you to schedule a visit.   I also referred you to an audiologist and they will call you. Let me know if you do not hear from them in the next 2 weeks.    Preventive Care 18-43 Years Old, Male Preventive care refers to lifestyle choices and visits with your health care provider that can promote health and wellness. Preventive care visits are also called wellness exams. What can I expect for my preventive care visit? Counseling During your preventive care visit, your health care provider may ask about your: Medical history, including: Past medical problems. Family medical history. Current health, including: Emotional well-being. Home life and relationship well-being. Sexual activity. Lifestyle, including: Alcohol, nicotine or tobacco, and drug use. Access to firearms. Diet, exercise, and sleep habits. Safety issues such as seatbelt and bike helmet use. Sunscreen use. Work and work Astronomer. Physical exam Your health care provider will check your: Height and weight. These may be used to calculate your BMI (body mass index). BMI is a measurement that tells if you are at a healthy weight. Waist circumference. This measures the distance around your waistline. This measurement also tells if you are at a healthy weight and may help predict your risk of certain diseases, such as type 2 diabetes and high blood pressure. Heart rate and blood pressure. Body temperature. Skin for abnormal spots. What immunizations do I need?  Vaccines are usually given at various ages, according to a schedule. Your health care provider will recommend vaccines for you based on your age, medical history, and lifestyle or other factors, such as travel or where you work. What tests do I need? Screening Your health care provider may recommend  screening tests for certain conditions. This may include: Lipid and cholesterol levels. Diabetes screening. This is done by checking your blood sugar (glucose) after you have not eaten for a while (fasting). Hepatitis B test. Hepatitis C test. HIV (human immunodeficiency virus) test. STI (sexually transmitted infection) testing, if you are at risk. Lung cancer screening. Prostate cancer screening. Colorectal cancer screening. Talk with your health care provider about your test results, treatment options, and if necessary, the need for more tests. Follow these instructions at home: Eating and drinking  Eat a diet that includes fresh fruits and vegetables, whole grains, lean protein, and low-fat dairy products. Take vitamin and mineral supplements as recommended by your health care provider. Do not drink alcohol if your health care provider tells you not to drink. If you drink alcohol: Limit how much you have to 0-2 drinks a day. Know how much alcohol is in your drink. In the [place] one drink equals one 12 oz bottle of beer (355 mL), one 5 oz glass of wine (148 mL), or one 1 oz glass of hard liquor (44 mL). Lifestyle Brush your teeth every morning and night with fluoride toothpaste. Floss one time each day. Exercise for at least 30 minutes 5 or more days each week. Do not use any products that contain nicotine or tobacco. These products include cigarettes, chewing tobacco, and vaping devices, such as e-cigarettes. If you need help quitting, ask your health care provider. Do not use drugs. If you are sexually active, practice safe sex. Use a condom or other form of protection to prevent STIs. Take aspirin only as told by your health care  provider. Make sure that you understand how much to take and what form to take. Work with your health care provider to find out whether it is safe and beneficial for you to take aspirin daily. Find healthy ways to manage stress, such as: Meditation, yoga, or  listening to music. Journaling. Talking to a trusted person. Spending time with friends and family. Minimize exposure to UV radiation to reduce your risk of skin cancer. Safety Always wear your seat belt while driving or riding in a vehicle. Do not drive: If you have been drinking alcohol. Do not ride with someone who has been drinking. When you are tired or distracted. While texting. If you have been using any mind-altering substances or drugs. Wear a helmet and other protective equipment during sports activities. If you have firearms in your house, make sure you follow all gun safety procedures. What's next? Go to your health care provider once a year for an annual wellness visit. Ask your health care provider how often you should have your eyes and teeth checked. Stay up to date on all vaccines. This information is not intended to replace advice given to you by your health care provider. Make sure you discuss any questions you have with your health care provider. Document Revised: 01/23/2021 Document Reviewed: 01/23/2021 Elsevier Patient Education  2024 [employer].

## 2023-01-02 NOTE — Assessment & Plan Note (Signed)
Preventive health care reviewed.  Counseling on healthy lifestyle including diet and exercise.  Recommend regular dental and eye exams.  Immunizations reviewed.  Discussed safety.  

## 2023-01-02 NOTE — Assessment & Plan Note (Signed)
Normal ear exam. Referral to audiology.

## 2023-01-02 NOTE — Assessment & Plan Note (Signed)
Controlled on statin therapy. Refilled medication

## 2023-01-02 NOTE — Assessment & Plan Note (Signed)
Recheck CBC.  Check iron studies. 

## 2023-01-02 NOTE — Assessment & Plan Note (Signed)
HST ordered.

## 2023-01-02 NOTE — Progress Notes (Signed)
Complete physical exam  Patient: Gabriel Burgess   DOB: 04-May-1981   42 y.o. Male  MRN: [id number]  Subjective:    Chief Complaint  Patient presents with   Annual Exam    fasting   He is fairly new to the practice and here for a complete physical exam.  Sees chiropractor monthly for low back pain.  Managing fairly well but would like to see an orthopedist.   Hearing worse in loud settings -audiology referral   States he thinks he may have sleep apnea. Snores and his wife has mentioned that he stops breathing at times.   Feet feel cold year round now.   On Xarelto.   HLD- refill statin  Married.  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 The date shown is not the original due date.    Wears seatbelt always, uses sunscreen, smoke detectors in home and functioning, does not text while driving, feels safe in home environment.  Depression screening:    12/12/2022    8:08 AM 10/30/2021    1:17 PM  Depression screen PHQ 2/9  Decreased Interest 0 0  Down, Depressed, Hopeless 0 0  PHQ - 2 Score 0 0   Anxiety Screening:     No data to display          Vision:Within last year and Dental: No current dental problems and Receives regular dental care  Patient Active Problem List   Diagnosis Date Noted   Snores 01/02/2023   Hearing decreased, bilateral 01/02/2023   Witnessed episode of apnea 01/02/2023   Bilateral cold feet 01/02/2023   Thrombocytosis 01/02/2023   Encounter for general adult medical examination with abnormal findings 01/02/2023   Factor V Leiden (HCC) 12/12/2022   On anticoagulant therapy 12/12/2022   Chronic bilateral low back pain without sciatica 12/12/2022   Hyperlipidemia  11/01/2021   Acute deep vein thrombosis (DVT) of right peroneal vein (HCC) 10/08/2021   Past Medical History:  Diagnosis Date   Allergy    DVT (deep venous thrombosis) (HCC)    right leg   Encounter for general adult medical examination with abnormal findings 01/02/2023   Witnessed episode of apnea 01/02/2023   Past Surgical History:  Procedure Laterality Date   WISDOM TOOTH EXTRACTION     Social History   Tobacco Use   Smoking status: Former    Packs/day: .5    Types: Cigarettes    Passive exposure: Past   Smokeless tobacco: Never  Vaping Use   Vaping Use: Never used  Substance Use Topics   Alcohol use: Yes    Alcohol/week: 4.0 standard drinks of alcohol    Types: 2 Glasses of wine, 2 Standard drinks or equivalent per week    Comment: occassionally   Drug use: Not Currently      Patient Care Team: Avanell Shackleton, NP-C as PCP - General (Family Medicine)   Outpatient Medications Prior to Visit  Medication Sig   rivaroxaban (XARELTO) 10 MG TABS tablet Take 1 tablet (10 mg total) by mouth daily.   [DISCONTINUED] atorvastatin (LIPITOR) 20 MG tablet TAKE 1 TABLET BY MOUTH EVERY DAY   No facility-administered  medications prior to visit.    Review of Systems  Constitutional:  Negative for chills, fever, malaise/fatigue and weight loss.  HENT:  Positive for hearing loss. Negative for congestion, ear pain, sinus pain and sore throat.   Eyes:  Negative for blurred vision, double vision and pain.  Respiratory:  Negative for cough, shortness of breath and wheezing.   Cardiovascular:  Negative for chest pain, palpitations and leg swelling.  Gastrointestinal:  Negative for abdominal pain, constipation, diarrhea, nausea and vomiting.  Genitourinary:  Negative for dysuria, frequency and urgency.  Musculoskeletal:  Positive for back pain. Negative for joint pain and myalgias.  Skin:  Negative for rash.  Neurological:  Negative for dizziness, tingling, focal weakness and  headaches.  Endo/Heme/Allergies:  Does not bruise/bleed easily.  Psychiatric/Behavioral:  Negative for depression and suicidal ideas. The patient is not nervous/anxious.        Objective:    BP 122/76 (BP Location: Left Arm, Patient Position: Sitting, Cuff Size: Large)   Pulse 70   Temp 97.8 F (36.6 C) (Temporal)   Ht 5\' 10"  (1.778 m)   Wt 208 lb (94.3 kg)   SpO2 98%   BMI 29.84 kg/m  BP Readings from Last 3 Encounters:  01/02/23 122/76  12/12/22 122/76  10/06/22 139/74   Wt Readings from Last 3 Encounters:  01/02/23 208 lb (94.3 kg)  12/12/22 206 lb (93.4 kg)  10/06/22 214 lb 3.2 oz (97.2 kg)    Physical Exam Constitutional:      General: He is not in acute distress.    Appearance: He is not ill-appearing.  HENT:     Right Ear: Tympanic membrane, ear canal and external ear normal.     Left Ear: Tympanic membrane, ear canal and external ear normal.     Nose: Nose normal.     Mouth/Throat:     Mouth: Mucous membranes are moist.     Pharynx: Oropharynx is clear.  Eyes:     Extraocular Movements: Extraocular movements intact.     Conjunctiva/sclera: Conjunctivae normal.     Pupils: Pupils are equal, round, and reactive to light.  Neck:     Thyroid: No thyroid mass, thyromegaly or thyroid tenderness.  Cardiovascular:     Rate and Rhythm: Normal rate and regular rhythm.     Pulses: Normal pulses.     Heart sounds: Normal heart sounds.  Pulmonary:     Effort: Pulmonary effort is normal.     Breath sounds: Normal breath sounds.  Abdominal:     General: Bowel sounds are normal. There is no distension.     Palpations: Abdomen is soft.     Tenderness: There is no abdominal tenderness. There is no right CVA tenderness, left CVA tenderness, guarding or rebound.  Musculoskeletal:        General: Normal range of motion.     Cervical back: Normal range of motion and neck supple. No tenderness.     Right lower leg: No edema.     Left lower leg: No edema.   Lymphadenopathy:     Cervical: No cervical adenopathy.  Skin:    General: Skin is warm and dry.     Findings: No lesion or rash.  Neurological:     General: No focal deficit present.     Mental Status: He is alert and oriented to person, place, and time.     Cranial Nerves: No cranial nerve deficit.     Sensory: No sensory deficit.  Motor: No weakness.  Psychiatric:        Mood and Affect: Mood normal.        Behavior: Behavior normal.        Thought Content: Thought content normal.      Results for orders placed or performed in visit on 01/02/23  Vitamin B12  Result Value Ref Range   Vitamin B-12 195 (L) 211 - 911 pg/mL  T4, free  Result Value Ref Range   Free T4 0.75 0.60 - 1.60 ng/dL  TSH  Result Value Ref Range   TSH 2.46 0.35 - 5.50 uIU/mL  CBC with Differential/Platelet  Result Value Ref Range   WBC 5.9 4.0 - 10.5 K/uL   RBC 5.14 4.22 - 5.81 Mil/uL   Hemoglobin 15.3 13.0 - 17.0 g/dL   HCT 16.1 09.6 - 04.5 %   MCV 89.5 78.0 - 100.0 fl   MCHC 33.3 30.0 - 36.0 g/dL   RDW 40.9 81.1 - 91.4 %   Platelets 354.0 150.0 - 400.0 K/uL   Neutrophils Relative % 52.2 43.0 - 77.0 %   Lymphocytes Relative 34.6 12.0 - 46.0 %   Monocytes Relative 8.4 3.0 - 12.0 %   Eosinophils Relative 4.0 0.0 - 5.0 %   Basophils Relative 0.8 0.0 - 3.0 %   Neutro Abs 3.1 1.4 - 7.7 K/uL   Lymphs Abs 2.0 0.7 - 4.0 K/uL   Monocytes Absolute 0.5 0.1 - 1.0 K/uL   Eosinophils Absolute 0.2 0.0 - 0.7 K/uL   Basophils Absolute 0.0 0.0 - 0.1 K/uL  Comprehensive metabolic panel  Result Value Ref Range   Sodium 142 135 - 145 mEq/L   Potassium 4.3 3.5 - 5.1 mEq/L   Chloride 104 96 - 112 mEq/L   CO2 28 19 - 32 mEq/L   Glucose, Bld 98 70 - 99 mg/dL   BUN 12 6 - 23 mg/dL   Creatinine, Ser 7.82 0.40 - 1.50 mg/dL   Total Bilirubin 0.7 0.2 - 1.2 mg/dL   Alkaline Phosphatase 77 39 - 117 U/L   AST 23 0 - 37 U/L   ALT 37 0 - 53 U/L   Total Protein 7.7 6.0 - 8.3 g/dL   Albumin 4.8 3.5 - 5.2 g/dL   GFR  956.21 >30.86 mL/min   Calcium 9.9 8.4 - 10.5 mg/dL      Assessment & Plan:    Routine Health Maintenance and Physical Exam  Problem List Items Addressed This Visit       Hematopoietic and Hemostatic   Thrombocytosis    Recheck CBC. Check iron studies      Relevant Orders   Comprehensive metabolic panel (Completed)   CBC with Differential/Platelet (Completed)   Vitamin B12 (Completed)   Iron, TIBC and Ferritin Panel     Other   Bilateral cold feet    Check labs      Relevant Orders   TSH (Completed)   T4, free (Completed)   Vitamin B12 (Completed)   Chronic bilateral low back pain without sciatica    He has been seeing chiropractor. Referral to orthopedist       Relevant Orders   Ambulatory referral to Orthopedic Surgery   Encounter for general adult medical examination with abnormal findings - Primary    Preventive health care reviewed.  Counseling on healthy lifestyle including diet and exercise.  Recommend regular dental and eye exams.  Immunizations reviewed.  Discussed safety.       Hearing decreased, bilateral  Normal ear exam. Referral to audiology       Relevant Orders   Ambulatory referral to Audiology   Hyperlipidemia    Controlled on statin therapy. Refilled medication      Relevant Medications   atorvastatin (LIPITOR) 20 MG tablet   On anticoagulant therapy    Managed by hematologist       Snores    HST ordered       Relevant Orders   Home sleep test   Witnessed episode of apnea    HST ordered       Relevant Orders   Home sleep test    Return in about 1 year (around 01/02/2024).     Hetty Blend, NP-C

## 2023-01-02 NOTE — Assessment & Plan Note (Signed)
Managed by hematologist 

## 2023-01-02 NOTE — Assessment & Plan Note (Signed)
Check labs 

## 2023-01-03 LAB — IRON,TIBC AND FERRITIN PANEL
%SAT: 55 % (calc) — ABNORMAL HIGH (ref 20–48)
Ferritin: 101 ng/mL (ref 38–380)
Iron: 183 ug/dL — ABNORMAL HIGH (ref 50–180)
TIBC: 334 mcg/dL (calc) (ref 250–425)

## 2023-01-05 NOTE — Progress Notes (Signed)
His vitamin B12 level is very low.  I recommend that he come in for B12 injections once weekly x 4 weeks and then monthly after that, or after the 4 wks of injections, he can try taking an oral OTC vitamin B12 supplement and then recheck his B12 afterwards.   I also recommend that he take a multivitamin.

## 2023-01-09 ENCOUNTER — Ambulatory Visit: Payer: BC Managed Care – PPO | Admitting: Physical Medicine and Rehabilitation

## 2023-01-09 ENCOUNTER — Ambulatory Visit (INDEPENDENT_AMBULATORY_CARE_PROVIDER_SITE_OTHER): Payer: BC Managed Care – PPO | Admitting: *Deleted

## 2023-01-09 DIAGNOSIS — E538 Deficiency of other specified B group vitamins: Secondary | ICD-10-CM

## 2023-01-09 MED ORDER — CYANOCOBALAMIN 1000 MCG/ML IJ SOLN
1000.0000 ug | Freq: Once | INTRAMUSCULAR | Status: AC
Start: 2023-01-09 — End: 2023-01-09
  Administered 2023-01-09: 1000 ug via INTRAMUSCULAR

## 2023-01-09 NOTE — Progress Notes (Signed)
Pls cosign for B12 inj../lmb  

## 2023-01-19 ENCOUNTER — Encounter: Payer: Self-pay | Admitting: Physical Medicine and Rehabilitation

## 2023-01-19 ENCOUNTER — Ambulatory Visit: Payer: BC Managed Care – PPO | Admitting: Physical Medicine and Rehabilitation

## 2023-01-19 ENCOUNTER — Other Ambulatory Visit (INDEPENDENT_AMBULATORY_CARE_PROVIDER_SITE_OTHER): Payer: BC Managed Care – PPO

## 2023-01-19 DIAGNOSIS — G8929 Other chronic pain: Secondary | ICD-10-CM

## 2023-01-19 DIAGNOSIS — M545 Low back pain, unspecified: Secondary | ICD-10-CM

## 2023-01-19 DIAGNOSIS — M47819 Spondylosis without myelopathy or radiculopathy, site unspecified: Secondary | ICD-10-CM

## 2023-01-19 DIAGNOSIS — M47816 Spondylosis without myelopathy or radiculopathy, lumbar region: Secondary | ICD-10-CM

## 2023-01-19 NOTE — Progress Notes (Unsigned)
Gabriel Burgess - 42 y.o. male MRN [id number]  Date of birth: 11-01-1980  Office Visit Note: Visit Date: 01/19/2023 PCP: Avanell Shackleton, NP-C Referred by: Avanell Shackleton, NP-C  Subjective: Chief Complaint  Patient presents with   Lower Back - Pain   HPI: Gabriel Burgess is a 42 y.o. male who comes in today per the request of Hetty Blend, NP for evaluation of chronic bilateral lower back pain, intermittent radiation to hips. Pain ongoing intermittently since his late 20's, worsens with movement and activity. Lower back seems to be stiff in the mornings after waking up. He describes pain as sore and sharp sensation, currently denies pain at this pain. Some relief of pain with home exercise regimen, rest and use of medications. Some relief of pain with physical therapy/chiropractic treatments. Reports good relief of pain with lumbar decompression and laser therapy. Patient evaluated at [facility] in February for acute exacerbation, treated with oral methocarbamol, prednisone and diclofenac. Pain eventually resolved over time. No prior imaging of lumbar spine. No history of surgery/injections. Patient denies focal weakness, numbness and tingling. No recent trauma or falls. Of note, patient does carry diagnosis of Factor V Liden, history of DVT, currently taking Xarelto.     Oswestry Disability Index Score 8% 0 to 10 (20%)   minimal disability: The patient can cope with most living activities. Usually no treatment is indicated apart from advice on lifting sitting and exercise.  Review of Systems  Musculoskeletal:  Positive for back pain.  Neurological:  Negative for tingling, sensory change, focal weakness and weakness.  All other systems reviewed and are negative.  Otherwise per HPI.  Assessment & Plan: Visit Diagnoses:    ICD-10-CM   1. Chronic bilateral low back pain without sciatica  M54.50 XR Lumbar Spine 2-3 Views   G89.29     2. Spondylosis without myelopathy or  radiculopathy  M47.819     3. Facet hypertrophy of lumbar region  M47.816        Plan: Findings:  Chronic bilateral lower back pain, intermittent radiation to hips. Patient continues to have pain despite good conservative therapies such as formal physical therapy/chiropractic treatments, home exercise regimen, rest and use of medications. I obtained lumbar x-rays in the office today that show mild degenerative changes at L5-S1, otherwise imaging is unremarkable. Patient denies pain currently, his presentation seems more consistent with facet mediated pain, however I do feel there is a myofascial component contributing to his symptoms. No radicular symptoms down the legs. I discussed treatment plan in detail with him today, would recommend focusing on core strengthening and home exercise regimen. I provided him with Dr. Lu Duffel McGill's "Big Three" exercises. I also recommend re-grouping with our physical therapy department. He would like to continue with chiropractic treatments and home exercise regimen at this time. We are happy to see him back if pain increases or changes in nature. I encouraged patient to remain active as tolerated. No red flag symptoms noted upon exam today.     Meds & Orders: No orders of the defined types were placed in this encounter.   Orders Placed This Encounter  Procedures   XR Lumbar Spine 2-3 Views    Follow-up: Return if symptoms worsen or fail to improve.   Procedures: No procedures performed      Clinical History: No specialty comments available.   He reports that he has quit smoking. His smoking use included cigarettes. He smoked an average of .5 packs per day. He has  been exposed to tobacco smoke. He has never used smokeless tobacco. No results for input(s): "HGBA1C", "LABURIC" in the last 8760 hours.  Objective:  VS:  HT:    WT:   BMI:     BP:   HR: bpm  TEMP: ( )  RESP:  Physical Exam Vitals and nursing note reviewed.  HENT:     Head:  Normocephalic and atraumatic.     Right Ear: External ear normal.     Left Ear: External ear normal.     Nose: Nose normal.     Mouth/Throat:     Mouth: Mucous membranes are moist.  Eyes:     Extraocular Movements: Extraocular movements intact.  Cardiovascular:     Rate and Rhythm: Normal rate.     Pulses: Normal pulses.  Pulmonary:     Effort: Pulmonary effort is normal.  Abdominal:     General: Abdomen is flat. There is no distension.  Musculoskeletal:        General: Tenderness present.     Cervical back: Normal range of motion.     Comments: Patient rises from seated position to standing without difficulty. Good lumbar range of motion. No pain noted with facet loading. 5/5 strength noted with bilateral hip flexion, knee flexion/extension, ankle dorsiflexion/plantarflexion and EHL. No clonus noted bilaterally. No pain upon palpation of greater trochanters. No pain with internal/external rotation of bilateral hips. Sensation intact bilaterally. Negative slump test bilaterally. Ambulates without aid, gait steady.     Skin:    General: Skin is warm and dry.     Capillary Refill: Capillary refill takes less than 2 seconds.  Neurological:     General: No focal deficit present.     Mental Status: He is alert and oriented to person, place, and time.  Psychiatric:        Mood and Affect: Mood normal.        Behavior: Behavior normal.     Ortho Exam  Imaging: XR Lumbar Spine 2-3 Views  Result Date: 01/19/2023 AP and lateral radiographs of the lumbar spine show normal anatomical alignment. Well preserved disc spacing. No spondylolisthesis. Mild degenerative changes at L5-S1. No acute fractures or dislocations.    Past Medical/Family/Surgical/Social History: Medications & Allergies reviewed per EMR, new medications updated. Patient Active Problem List   Diagnosis Date Noted   Snores 01/02/2023   Hearing decreased, bilateral 01/02/2023   Witnessed episode of apnea 01/02/2023    Bilateral cold feet 01/02/2023   Thrombocytosis 01/02/2023   Encounter for general adult medical examination with abnormal findings 01/02/2023   Factor V Leiden (HCC) 12/12/2022   On anticoagulant therapy 12/12/2022   Chronic bilateral low back pain without sciatica 12/12/2022   Hyperlipidemia 11/01/2021   Acute deep vein thrombosis (DVT) of right peroneal vein (HCC) 10/08/2021   Past Medical History:  Diagnosis Date   Allergy    DVT (deep venous thrombosis) (HCC)    right leg   Encounter for general adult medical examination with abnormal findings 01/02/2023   Witnessed episode of apnea 01/02/2023   Family History  Problem Relation Age of Onset   Hearing loss Mother    Anxiety disorder Father    Sleep apnea Father    Stroke Maternal Grandmother    AAA (abdominal aortic aneurysm) Maternal Grandfather    Cancer Paternal Grandmother    Cerebral aneurysm Paternal Grandfather    Past Surgical History:  Procedure Laterality Date   WISDOM TOOTH EXTRACTION     Social History  Occupational History   Not on file  Tobacco Use   Smoking status: Former    Packs/day: .5    Types: Cigarettes    Passive exposure: Past   Smokeless tobacco: Never  Vaping Use   Vaping Use: Never used  Substance and Sexual Activity   Alcohol use: Yes    Alcohol/week: 4.0 standard drinks of alcohol    Types: 2 Glasses of wine, 2 Standard drinks or equivalent per week    Comment: occassionally   Drug use: Not Currently   Sexual activity: Yes    Partners: Female    Birth control/protection: None

## 2023-01-19 NOTE — Patient Instructions (Addendum)

## 2023-01-19 NOTE — Progress Notes (Unsigned)
Functional Pain Scale - descriptive words and definitions  Mild (2)   Noticeable when not distracted/no impact on ADL's/sleep only slightly affected and able to   use both passive and active distraction for comfort. Mild range order  Average Pain 0  Lower back pain on both sides that radiates into hips

## 2023-01-23 ENCOUNTER — Ambulatory Visit: Payer: BC Managed Care – PPO | Attending: Family Medicine | Admitting: Audiologist

## 2023-01-23 ENCOUNTER — Ambulatory Visit (INDEPENDENT_AMBULATORY_CARE_PROVIDER_SITE_OTHER): Payer: BC Managed Care – PPO

## 2023-01-23 DIAGNOSIS — E538 Deficiency of other specified B group vitamins: Secondary | ICD-10-CM | POA: Diagnosis not present

## 2023-01-23 DIAGNOSIS — H903 Sensorineural hearing loss, bilateral: Secondary | ICD-10-CM | POA: Diagnosis not present

## 2023-01-23 MED ORDER — CYANOCOBALAMIN 1000 MCG/ML IJ SOLN
1000.0000 ug | Freq: Once | INTRAMUSCULAR | Status: AC
Start: 2023-01-23 — End: 2023-01-23
  Administered 2023-01-23: 1000 ug via INTRAMUSCULAR

## 2023-01-23 NOTE — Progress Notes (Signed)
B12 given.  Pt tolerated well. Pt is aware to give the office a call for an side effects or reactions. Please co-sign.   

## 2023-01-23 NOTE — Procedures (Signed)
  Outpatient Audiology and [facility] [address] [place], [place]  [address] [phone]  AUDIOLOGICAL  EVALUATION  NAME: Gabriel Burgess     DOB:   07/01/1981      MRN: [id number]                                                                                     DATE: 01/23/2023     REFERENT: Avanell Shackleton, NP-C STATUS: Outpatient DIAGNOSIS: Mild Sensorineural Hearing Loss Bilateral   History: [name] was seen for an audiological evaluation due to difficulty hearing people from a distance. He often cannot hear his wife clearly. He does hear well in restaurants. No pain, pressure, or tinnitus. His family has history of hearing aid use. 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 No other case history reported and no relevant medical history.   Evaluation:  Otoscopy showed a clear view of the tympanic membranes, bilaterally Tympanometry results were consistent with normal middle ear function, bilaterally   Audiometric testing was completed using Conventional Audiometry techniques with supraural headphones. Test results are consistent with mild hearing loss at [facility] bilaterally, hearing normal 250-6kHz. Speech Recognition Thresholds were obtained at  20dB HL in the right ear and at 10dB HL in the left ear. Word Recognition Testing was completed at 50dB HL and [name] scored 100% in each ear. QuickSIN 3.5dB SNR showing normal to mild loss in noise.    Results:  The test results were reviewed with Gabriel Burgess has essentially normal hearing. No need for hearing aids or regular monitoring. Communicate face to face within five feet to hear clearly.   Recommendations: 1.   No further testing is recommended at this time. If hearing difficulties are observed further audiological testing is recommended.          32 minutes spent testing and counseling on results.   If you have any questions please feel free to contact me at [phone]  [phone].  Ammie Ferrier Au.D.  Audiologist   01/23/2023  9:32 AM  Cc: Henson, Vickie L, NP-C

## 2023-01-30 ENCOUNTER — Ambulatory Visit (INDEPENDENT_AMBULATORY_CARE_PROVIDER_SITE_OTHER): Payer: BC Managed Care – PPO

## 2023-01-30 DIAGNOSIS — E538 Deficiency of other specified B group vitamins: Secondary | ICD-10-CM | POA: Diagnosis not present

## 2023-01-30 MED ORDER — CYANOCOBALAMIN 1000 MCG/ML IJ SOLN
1000.0000 ug | Freq: Once | INTRAMUSCULAR | Status: AC
Start: 2023-01-30 — End: 2023-01-30
  Administered 2023-01-30: 1000 ug via INTRAMUSCULAR

## 2023-02-03 NOTE — Progress Notes (Signed)
B12 given.  Pt tolerated well. Pt is aware to give the office a call for an side effects or reactions. Please co-sign.   

## 2023-02-17 ENCOUNTER — Ambulatory Visit (HOSPITAL_BASED_OUTPATIENT_CLINIC_OR_DEPARTMENT_OTHER): Payer: BC Managed Care – PPO | Attending: Family Medicine | Admitting: Internal Medicine

## 2023-02-17 DIAGNOSIS — R0683 Snoring: Secondary | ICD-10-CM

## 2023-02-17 DIAGNOSIS — R0681 Apnea, not elsewhere classified: Secondary | ICD-10-CM

## 2023-02-27 ENCOUNTER — Ambulatory Visit (INDEPENDENT_AMBULATORY_CARE_PROVIDER_SITE_OTHER): Payer: BC Managed Care – PPO

## 2023-02-27 ENCOUNTER — Telehealth: Payer: Self-pay | Admitting: Hematology and Oncology

## 2023-02-27 DIAGNOSIS — E538 Deficiency of other specified B group vitamins: Secondary | ICD-10-CM | POA: Diagnosis not present

## 2023-02-27 MED ORDER — CYANOCOBALAMIN 1000 MCG/ML IJ SOLN
1000.0000 ug | Freq: Once | INTRAMUSCULAR | Status: AC
Start: 2023-02-27 — End: 2023-02-27
  Administered 2023-02-27: 1000 ug via INTRAMUSCULAR

## 2023-02-27 NOTE — Progress Notes (Signed)
Pt was given second monthly of 3 B12 w/o any complications.

## 2023-03-11 ENCOUNTER — Encounter (INDEPENDENT_AMBULATORY_CARE_PROVIDER_SITE_OTHER): Payer: Self-pay

## 2023-03-13 ENCOUNTER — Telehealth: Payer: Self-pay | Admitting: Hematology and Oncology

## 2023-03-19 ENCOUNTER — Other Ambulatory Visit: Payer: Self-pay | Admitting: Oncology

## 2023-03-19 DIAGNOSIS — Z006 Encounter for examination for normal comparison and control in clinical research program: Secondary | ICD-10-CM

## 2023-04-03 ENCOUNTER — Ambulatory Visit: Payer: BC Managed Care – PPO

## 2023-04-09 ENCOUNTER — Ambulatory Visit: Payer: BC Managed Care – PPO | Admitting: Hematology and Oncology

## 2023-04-09 ENCOUNTER — Other Ambulatory Visit: Payer: BC Managed Care – PPO

## 2023-04-10 ENCOUNTER — Ambulatory Visit: Payer: BC Managed Care – PPO | Admitting: Hematology and Oncology

## 2023-04-10 ENCOUNTER — Ambulatory Visit: Payer: BC Managed Care – PPO

## 2023-04-10 ENCOUNTER — Other Ambulatory Visit: Payer: BC Managed Care – PPO

## 2023-04-10 DIAGNOSIS — E538 Deficiency of other specified B group vitamins: Secondary | ICD-10-CM

## 2023-04-10 MED ORDER — CYANOCOBALAMIN 1000 MCG/ML IJ SOLN
1000.0000 ug | Freq: Once | INTRAMUSCULAR | Status: AC
Start: 2023-04-10 — End: 2023-04-10
  Administered 2023-04-10: 1000 ug via INTRAMUSCULAR

## 2023-04-10 NOTE — Progress Notes (Signed)
B12 given.  Pt tolerated well. Pt is aware to give the office a call for an side effects or reactions. Please co-sign.   

## 2023-04-15 ENCOUNTER — Encounter: Payer: Self-pay | Admitting: Family Medicine

## 2023-04-15 NOTE — Telephone Encounter (Signed)
Please advise 

## 2023-04-16 ENCOUNTER — Other Ambulatory Visit: Payer: Self-pay

## 2023-04-16 DIAGNOSIS — E538 Deficiency of other specified B group vitamins: Secondary | ICD-10-CM

## 2023-04-21 DIAGNOSIS — M546 Pain in thoracic spine: Secondary | ICD-10-CM | POA: Diagnosis not present

## 2023-04-21 DIAGNOSIS — R109 Unspecified abdominal pain: Secondary | ICD-10-CM | POA: Diagnosis not present

## 2023-04-23 ENCOUNTER — Other Ambulatory Visit: Payer: Self-pay | Admitting: Hematology and Oncology

## 2023-04-23 DIAGNOSIS — I82461 Acute embolism and thrombosis of right calf muscular vein: Secondary | ICD-10-CM

## 2023-04-23 NOTE — Progress Notes (Signed)
[facility] [phone]   [phone]  PROGRESS NOTE  Patient Care Team: Avanell Shackleton, NP-C as PCP - General (Family Medicine)  Hematological/Oncological History # Family History of Factor V Leiden # Unprovoked Right Lower Extremity DVT  10/06/2021: Patient underwent a right lower extremity ultrasound which showed an acute deep vein thrombosis involving the right  popliteal vein, right posterior tibial veins, and right peroneal veins.  Same-day CT PE study showed no evidence of pulmonary embolism. 12/22/2021: Establish care with Dr. Leonides Schanz  Interval History:  Gabriel Burgess 42 y.o. 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 The patient's last visit was on 10/06/2022.  In the interim since the last visit he has continued on Xarelto therapy as prescribed  On exam today Gabriel Burgess reports he has had no major changes in his health in the interim since our last visit.  He was told that his primary care provider noted he had low vitamin B12 levels and recently started vitamin B12 shots.  He notes soon he will be switching to vitamin B12 pills.  He notes he does not have any changes in his energy though he is "chasing around a 89-year-old".  He notes that he is not having any issues with his Xarelto medication is still pending about a dollar per pill.  He notes he is not having any nosebleeds or gum bleeding.  He is not having any signs or symptoms concerning for recurrent VTE such as leg pain, leg swelling, chest pain, or shortness of breath.  Overall he feels well and has no questions or concerns regarding anticoagulation therapy.  He denies any fevers, chills, sweats, nausea vomiting or diarrhea. A full 10 point ROS was otherwise negative.   MEDICAL HISTORY:  Past Medical History:  Diagnosis Date   Allergy    DVT (deep venous thrombosis) (HCC)    right  leg   Encounter for general adult medical examination with abnormal findings 01/02/2023   Witnessed episode of apnea 01/02/2023    SURGICAL HISTORY: Past Surgical History:  Procedure Laterality Date   WISDOM TOOTH EXTRACTION      SOCIAL HISTORY: Social History   Socioeconomic History   Marital status: Married    Spouse name: Not on file   Number of children: Not on file   Years of education: Not on file   Highest education level: Not on file  Occupational History   Not on file  Tobacco Use   Smoking status: Former    Current packs/day: 0.50    Types: Cigarettes    Passive exposure: Past   Smokeless tobacco: Never  Vaping Use   Vaping status: Never Used  Substance and Sexual Activity   Alcohol use: Yes    Alcohol/week: 4.0 standard drinks of alcohol    Types: 2 Glasses of wine, 2 Standard drinks or equivalent per week    Comment: occassionally   Drug use: Not Currently   Sexual activity: Yes    Partners: Female    Birth control/protection: None  Other Topics Concern   Not on file  Social History Narrative   Not on file   Social Determinants of Health   Financial Resource Strain: Not on file  Food Insecurity: Not on file  Transportation Needs: Not on file  Physical Activity: Not on file  Stress: Not on file  Social Connections: Unknown (09/26/2022)   [facility]   Social Network    Social Network: Not on file  Intimate Partner Violence: Unknown (09/26/2022)   Received from [facility], [facility]   HITS    Physically Hurt: Not on file    Insult or Talk Down To: Not on file    Threaten Physical Harm: Not on file    Scream or Curse: Not on file    FAMILY HISTORY: Family History  Problem Relation Age of Onset   Hearing loss Mother    Anxiety disorder Father    Sleep apnea Father    Stroke Maternal Grandmother    AAA (abdominal aortic aneurysm) Maternal Grandfather    Cancer Paternal Grandmother    Cerebral aneurysm  Paternal Grandfather     ALLERGIES:  is allergic to amoxicillin.  MEDICATIONS:  Current Outpatient Medications  Medication Sig Dispense Refill   atorvastatin (LIPITOR) 20 MG tablet Take 1 tablet (20 mg total) by mouth daily. 90 tablet 3   rivaroxaban (XARELTO) 10 MG TABS tablet Take 1 tablet (10 mg total) by mouth daily. 90 tablet 3   No current facility-administered medications for this visit.    REVIEW OF SYSTEMS:   Constitutional: ( - ) fevers, ( - )  chills , ( - ) night sweats Eyes: ( - ) blurriness of vision, ( - ) double vision, ( - ) watery eyes Ears, nose, mouth, throat, and face: ( - ) mucositis, ( - ) sore throat Respiratory: ( - ) cough, ( - ) dyspnea, ( - ) wheezes Cardiovascular: ( - ) palpitation, ( - ) chest discomfort, ( - ) lower extremity swelling Gastrointestinal:  ( - ) nausea, ( - ) heartburn, ( - ) change in bowel habits Skin: ( - ) abnormal skin rashes Lymphatics: ( - ) new lymphadenopathy, ( - ) easy bruising Neurological: ( - ) numbness, ( - ) tingling, ( - ) new weaknesses Behavioral/Psych: ( - ) mood change, ( - ) new changes  All other systems were reviewed with the patient and are negative.  PHYSICAL EXAMINATION:  Vitals:   04/24/23 0825  BP: 115/75  Pulse: 67  Resp: 13  Temp: (!) 97.5 F (36.4 C)  SpO2: 100%    Filed Weights   04/24/23 0825  Weight: 206 lb 8 oz (93.7 kg)     GENERAL: Well-appearing middle-age Caucasian male, alert, no distress and comfortable SKIN: skin color, texture, turgor are normal, no rashes or significant lesions EYES: conjunctiva are pink and non-injected, sclera clear LUNGS: clear to auscultation and percussion with normal breathing effort HEART: regular rate & rhythm and no murmurs and no lower extremity edema Musculoskeletal: no cyanosis of digits and no clubbing  PSYCH: alert & oriented x 3, fluent speech NEURO: no focal motor/sensory deficits  LABORATORY DATA:  I have reviewed the data as listed     Latest Ref Rng & Units 04/24/2023    7:57 AM 01/02/2023    9:12 AM 12/12/2022    8:31 AM  CBC  WBC 4.0 - 10.5 K/uL 6.7  5.9  7.1   Hemoglobin 13.0 - 17.0 g/dL 40.9  81.1  91.4   Hematocrit 39.0 - 52.0 % 45.4  46.0  44.8   Platelets 150 - 400 K/uL 353  354.0  432.0        Latest Ref Rng & Units 04/24/2023    7:57 AM 01/02/2023    9:12 AM 12/12/2022    8:31 AM  CMP  Glucose 70 -  99 mg/dL 161  98  99   BUN 6 - 20 mg/dL 15  12  15    Creatinine 0.61 - 1.24 mg/dL 0.96  0.45  4.09   Sodium 135 - 145 mmol/L 140  142  141   Potassium 3.5 - 5.1 mmol/L 4.6  4.3  4.1   Chloride 98 - 111 mmol/L 105  104  104   CO2 22 - 32 mmol/L 28  28  27    Calcium 8.9 - 10.3 mg/dL 81.1  9.9  9.9   Total Protein 6.5 - 8.1 g/dL 7.8  7.7  7.7   Total Bilirubin 0.3 - 1.2 mg/dL 0.9  0.7  0.9   Alkaline Phos 38 - 126 U/L 81  77  82   AST 15 - 41 U/L 23  23  29    ALT 0 - 44 U/L 39  37  52     RADIOGRAPHIC STUDIES: No results found.  ASSESSMENT & PLAN Gabriel Burgess 42 y.o. 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   After review of the labs, review of the records, and discussion with the patient the patients findings are most consistent with an unprovoked right lower extremity DVT.   A provoked venous thromboembolism (VTE) is one that has a clear inciting factor or event. Provoking factors include prolonged travel/immobility, surgery (particular abdominal or orthropedic), trauma,  and pregnancy/ estrogen containing birth control. After a detailed history and review of the records there is no clear provoking factor for this patient's VTE.  Patients with unprovoked VTEs have up to 25% recurrence after 5 years and 36% at 10 years, with 4% of these clots being fatal (BMJ?2019;366:l4363). Therefore the formal recommendation for unprovoked VTE's is lifelong anticoagulation, as the cause may not be transient or  reversible. We recommend 6 months or full strength anticoagulation with a re-evaluation after that time.  The patient's will then have a choice of maintenance dose DOAC (preferred, recommended), 81mg  ASA PO daily (non-preferred), or no further anticoagulation (not recommended).     # Unprovoked DVT/Pulmonary Embolism  --findings at this time are consistent with a unprovoked VTE  -- At each visit will order CMP and CBC to assure labs are adequate for DOAC therapy  --ruled out APS with anticardiolipin and anti beta2 glycoprotein antibodies.  Lupus anticoagulant panel would be altered by presence of blood thinner, will hold on this testing.   --patient denies any bleeding, bruising, or dark stools on this medication. It is well tolerated. No difficulties accessing/affording the medication  --recommend the patient continue xarelto, currently on maintenance dose 10 mg PO daily.  --Previously discussed maintenance dosing with the patient previously.  He was agreeable to dropping down to 10 mg p.o. daily.   -- labs today show WBC 6.7, hemoglobin 15.2, MCV 88.7, and platelets of 353 --RTC in 6 months' time with strict return precautions for overt signs of bleeding.     # Heterozygous for Factor V Leiden #  Family History of Factor V Leiden -- Patient found to be heterozygous for factor V Leiden. -- Patient has a young son.  At this time there is no indication for genetic testing of his child.  Patient notes that his wife is of [place] descent (low risk of being a FVL carrier).  No orders of the defined types were placed in this encounter.   All questions were answered. The patient  knows to call the clinic with any problems, questions or concerns.  A total of more than 25 minutes were spent on this encounter with face-to-face time and non-face-to-face time, including preparing to see the patient, ordering tests and/or medications, counseling the patient and coordination of care as outlined above.    Gabriel Barns, MD Department of Hematology/Oncology [facility] Phone: [phone] Pager: [phone] Email: [email or web address]  04/25/2023 2:09 PM

## 2023-04-24 ENCOUNTER — Other Ambulatory Visit (INDEPENDENT_AMBULATORY_CARE_PROVIDER_SITE_OTHER): Payer: BC Managed Care – PPO

## 2023-04-24 ENCOUNTER — Inpatient Hospital Stay: Payer: BC Managed Care – PPO | Attending: Hematology and Oncology

## 2023-04-24 ENCOUNTER — Inpatient Hospital Stay: Payer: BC Managed Care – PPO | Admitting: Hematology and Oncology

## 2023-04-24 VITALS — BP 115/75 | HR 67 | Temp 97.5°F | Resp 13 | Wt 206.5 lb

## 2023-04-24 DIAGNOSIS — Z86718 Personal history of other venous thrombosis and embolism: Secondary | ICD-10-CM | POA: Diagnosis not present

## 2023-04-24 DIAGNOSIS — D6851 Activated protein C resistance: Secondary | ICD-10-CM | POA: Diagnosis not present

## 2023-04-24 DIAGNOSIS — I82461 Acute embolism and thrombosis of right calf muscular vein: Secondary | ICD-10-CM | POA: Diagnosis not present

## 2023-04-24 DIAGNOSIS — Z7901 Long term (current) use of anticoagulants: Secondary | ICD-10-CM | POA: Insufficient documentation

## 2023-04-24 DIAGNOSIS — E538 Deficiency of other specified B group vitamins: Secondary | ICD-10-CM | POA: Diagnosis not present

## 2023-04-24 LAB — CBC WITH DIFFERENTIAL (CANCER CENTER ONLY)
Abs Immature Granulocytes: 0.01 10*3/uL (ref 0.00–0.07)
Basophils Absolute: 0 10*3/uL (ref 0.0–0.1)
Basophils Relative: 0 %
Eosinophils Absolute: 0.3 10*3/uL (ref 0.0–0.5)
Eosinophils Relative: 4 %
HCT: 45.4 % (ref 39.0–52.0)
Hemoglobin: 15.2 g/dL (ref 13.0–17.0)
Immature Granulocytes: 0 %
Lymphocytes Relative: 34 %
Lymphs Abs: 2.3 10*3/uL (ref 0.7–4.0)
MCH: 29.7 pg (ref 26.0–34.0)
MCHC: 33.5 g/dL (ref 30.0–36.0)
MCV: 88.7 fL (ref 80.0–100.0)
Monocytes Absolute: 0.5 10*3/uL (ref 0.1–1.0)
Monocytes Relative: 8 %
Neutro Abs: 3.5 10*3/uL (ref 1.7–7.7)
Neutrophils Relative %: 54 %
Platelet Count: 353 10*3/uL (ref 150–400)
RBC: 5.12 MIL/uL (ref 4.22–5.81)
RDW: 11.8 % (ref 11.5–15.5)
WBC Count: 6.7 10*3/uL (ref 4.0–10.5)
nRBC: 0 % (ref 0.0–0.2)

## 2023-04-24 LAB — CMP (CANCER CENTER ONLY)
ALT: 39 U/L (ref 0–44)
AST: 23 U/L (ref 15–41)
Albumin: 4.8 g/dL (ref 3.5–5.0)
Alkaline Phosphatase: 81 U/L (ref 38–126)
Anion gap: 7 (ref 5–15)
BUN: 15 mg/dL (ref 6–20)
CO2: 28 mmol/L (ref 22–32)
Calcium: 10.1 mg/dL (ref 8.9–10.3)
Chloride: 105 mmol/L (ref 98–111)
Creatinine: 1.09 mg/dL (ref 0.61–1.24)
GFR, Estimated: >60 mL/min (ref >60)
Glucose, Bld: 107 mg/dL — ABNORMAL HIGH (ref 70–99)
Potassium: 4.6 mmol/L (ref 3.5–5.1)
Sodium: 140 mmol/L (ref 135–145)
Total Bilirubin: 0.9 mg/dL (ref 0.3–1.2)
Total Protein: 7.8 g/dL (ref 6.5–8.1)

## 2023-04-24 LAB — VITAMIN B12: Vitamin B-12: 421 pg/mL (ref 211–911)

## 2023-04-28 ENCOUNTER — Other Ambulatory Visit: Payer: Self-pay | Admitting: Hematology and Oncology

## 2023-06-05 ENCOUNTER — Other Ambulatory Visit (HOSPITAL_COMMUNITY)
Admission: RE | Admit: 2023-06-05 | Discharge: 2023-06-05 | Disposition: A | Discharge status: Home / Self Care | Payer: BC Managed Care – PPO | Source: Ambulatory Visit | Attending: Oncology | Admitting: Oncology

## 2023-06-05 DIAGNOSIS — Z006 Encounter for examination for normal comparison and control in clinical research program: Secondary | ICD-10-CM | POA: Insufficient documentation

## 2023-06-16 LAB — HELIX MOLECULAR SCREEN: Genetic Analysis Overall Interpretation: NEGATIVE

## 2023-06-16 LAB — GENECONNECT MOLECULAR SCREEN

## 2023-07-16 ENCOUNTER — Encounter: Payer: Self-pay | Admitting: Family Medicine

## 2023-07-16 ENCOUNTER — Ambulatory Visit: Payer: BC Managed Care – PPO | Admitting: Family Medicine

## 2023-07-16 VITALS — BP 126/70 | HR 64 | Temp 97.6°F | Ht 72.0 in | Wt 214.0 lb

## 2023-07-16 DIAGNOSIS — E538 Deficiency of other specified B group vitamins: Secondary | ICD-10-CM | POA: Diagnosis not present

## 2023-07-16 DIAGNOSIS — R0683 Snoring: Secondary | ICD-10-CM

## 2023-07-16 DIAGNOSIS — E782 Mixed hyperlipidemia: Secondary | ICD-10-CM

## 2023-07-16 DIAGNOSIS — Z7901 Long term (current) use of anticoagulants: Secondary | ICD-10-CM

## 2023-07-16 LAB — VITAMIN B12: Vitamin B-12: 776 pg/mL (ref 211–911)

## 2023-07-16 NOTE — Assessment & Plan Note (Signed)
Currently on oral B12 supplement.  He was getting vitamin B12 injections and 3 months ago requested to take only oral supplementation.  Recheck vitamin B-12 level today.

## 2023-07-16 NOTE — Assessment & Plan Note (Signed)
Reports having a HST that "did not work" and he was advised that he would need to redo it. I do not see any information regarding this in his EMR. He would like to hold off for now and will let me know if he would like to pursue further testing. Discussed potential short-term and long-term health consequences related to untreated sleep apnea

## 2023-07-16 NOTE — Progress Notes (Signed)
Subjective:     Patient ID: Gabriel Burgess, male    DOB: 1981-01-24, 42 y.o.   MRN: [id number]  Chief Complaint  Patient presents with   Medical Management of Chronic Issues    3 month f/u    HPI   History of Present Illness         Here for 6 month follow up on chronic health conditions.   Diagnosed with unprovoked RLE DVT and is on Xarelto. Under the care of Dr. Leonides Schanz. Denies bleeding issues.   Vitamin B12 def in May 2024. He was getting B12 injections and has been on oral B12 for the past 2 months.   HLD- on statin daily   He does not want to pursue a sleep study at this time. States he tried a HST and it did not work.     There are no preventive care reminders to display for this patient.   Past Medical History:  Diagnosis Date   Allergy    DVT (deep venous thrombosis) (HCC)    right leg   Encounter for general adult medical examination with abnormal findings 01/02/2023   Witnessed episode of apnea 01/02/2023    Past Surgical History:  Procedure Laterality Date   WISDOM TOOTH EXTRACTION      Family History  Problem Relation Age of Onset   Hearing loss Mother    Anxiety disorder Father    Sleep apnea Father    Stroke Maternal Grandmother    AAA (abdominal aortic aneurysm) Maternal Grandfather    Cancer Paternal Grandmother    Cerebral aneurysm Paternal Grandfather     Social History   Socioeconomic History   Marital status: Married    Spouse name: Not on file   Number of children: Not on file   Years of education: Not on file   Highest education level: Bachelor's degree (e.g., BA, AB, BS)  Occupational History   Not on file  Tobacco Use   Smoking status: Former    Current packs/day: 0.50    Types: Cigarettes    Passive exposure: Past   Smokeless tobacco: Never  Vaping Use   Vaping status: Never Used  Substance and Sexual Activity   Alcohol use: Yes    Alcohol/week: 4.0 standard drinks of alcohol    Types: 2 Glasses of wine, 2 Standard  drinks or equivalent per week    Comment: occassionally   Drug use: Not Currently   Sexual activity: Yes    Partners: Female    Birth control/protection: None  Other Topics Concern   Not on file  Social History Narrative   Not on file   Social Determinants of Health   Financial Resource Strain: Low Risk  (07/14/2023)   Overall Financial Resource Strain (CARDIA)    Difficulty of Paying Living Expenses: Not very hard  Food Insecurity: No Food Insecurity (07/14/2023)   Hunger Vital Sign    Worried About Running Out of Food in the Last Year: Never true    Ran Out of Food in the Last Year: Never true  Transportation Needs: No Transportation Needs (07/14/2023)   PRAPARE - Administrator, Civil Service (Medical): No    Lack of Transportation (Non-Medical): No  Physical Activity: Sufficiently Active (07/14/2023)   Exercise Vital Sign    Days of Exercise per Week: 5 days    Minutes of Exercise per Session: 30 min  Stress: Stress Concern Present (07/14/2023)   [employer] of Occupational Health - Occupational Stress  Questionnaire    Feeling of Stress : To some extent  Social Connections: Moderately Isolated (07/14/2023)   Social Connection and Isolation Panel [NHANES]    Frequency of Communication with Friends and Family: More than three times a week    Frequency of Social Gatherings with Friends and Family: Once a week    Attends Religious Services: Never    Database administrator or Organizations: No    Attends Engineer, structural: Not on file    Marital Status: Married  Intimate Partner Violence: Unknown (09/26/2022)   Received from [employer], [facility]   HITS    Physically Hurt: Not on file    Insult or Talk Down To: Not on file    Threaten Physical Harm: Not on file    Scream or Curse: Not on file    Outpatient Medications Prior to Visit  Medication Sig Dispense Refill   atorvastatin (LIPITOR) 20 MG tablet Take 1 tablet (20 mg total) by mouth  daily. 90 tablet 3   XARELTO 10 MG TABS tablet TAKE 1 TABLET BY MOUTH EVERY DAY 90 tablet 3   No facility-administered medications prior to visit.    Allergies  Allergen Reactions   Amoxicillin     Review of Systems  Constitutional:  Negative for chills, fever and malaise/fatigue.  Respiratory:  Negative for shortness of breath.   Cardiovascular:  Negative for chest pain, palpitations and leg swelling.  Gastrointestinal:  Negative for abdominal pain, constipation, diarrhea, nausea and vomiting.  Genitourinary:  Negative for dysuria, frequency and urgency.  Neurological:  Negative for dizziness, focal weakness and headaches.       Objective:    Physical Exam Constitutional:      General: He is not in acute distress.    Appearance: He is not ill-appearing.  Eyes:     Extraocular Movements: Extraocular movements intact.     Conjunctiva/sclera: Conjunctivae normal.  Cardiovascular:     Rate and Rhythm: Normal rate.  Pulmonary:     Effort: Pulmonary effort is normal.  Musculoskeletal:     Cervical back: Normal range of motion and neck supple.  Skin:    General: Skin is warm and dry.  Neurological:     General: No focal deficit present.     Mental Status: He is alert and oriented to person, place, and time.  Psychiatric:        Mood and Affect: Mood normal.        Behavior: Behavior normal.        Thought Content: Thought content normal.      BP 126/70 (BP Location: Left Arm, Patient Position: Sitting, Cuff Size: Large)   Pulse 64   Temp 97.6 F (36.4 C) (Temporal)   Ht 6' (1.829 m)   Wt 214 lb (97.1 kg)   SpO2 96%   BMI 29.02 kg/m  Wt Readings from Last 3 Encounters:  07/16/23 214 lb (97.1 kg)  04/24/23 206 lb 8 oz (93.7 kg)  02/17/23 205 lb (93 kg)       Assessment & Plan:   Problem List Items Addressed This Visit     B12 deficiency - Primary    Currently on oral B12 supplement.  He was getting vitamin B12 injections and 3 months ago requested to take  only oral supplementation.  Recheck vitamin B-12 level today.      Relevant Orders   Vitamin B12   Hyperlipidemia    Controlled on statin therapy.  On anticoagulant therapy    Managed by hematologist       Snores    Reports having a HST that "did not work" and he was advised that he would need to redo it. I do not see any information regarding this in his EMR. He would like to hold off for now and will let me know if he would like to pursue further testing. Discussed potential short-term and long-term health consequences related to untreated sleep apnea       I am having Demetri Kellett maintain his atorvastatin and Xarelto.  No orders of the defined types were placed in this encounter.

## 2023-07-16 NOTE — Assessment & Plan Note (Signed)
Managed by hematologist 

## 2023-07-16 NOTE — Assessment & Plan Note (Signed)
Controlled on statin therapy. ?

## 2023-08-12 DIAGNOSIS — J45909 Unspecified asthma, uncomplicated: Secondary | ICD-10-CM

## 2023-08-12 HISTORY — DX: Unspecified asthma, uncomplicated: J45.909

## 2023-08-14 IMAGING — CT CT ANGIO CHEST
2 of 7 series · 19 of 46 positions shown · IV contrast (agent unspecified)
Comparison: None.

CLINICAL DATA: Pulmonary embolism (PE) suspected, high prob
positive DVT study

EXAM:
CT ANGIOGRAPHY CHEST WITH CONTRAST
TECHNIQUE: Multidetector CT imaging of the chest was performed using the
standard protocol during bolus administration of intravenous
contrast. Multiplanar CT image reconstructions and MIPs were
obtained to evaluate the vascular anatomy.

[Series 8: thins · axial · 0.87mm/px · z∈[-188,+87]mm · 16 of 311 slices shown]
[im 18/311  lung]
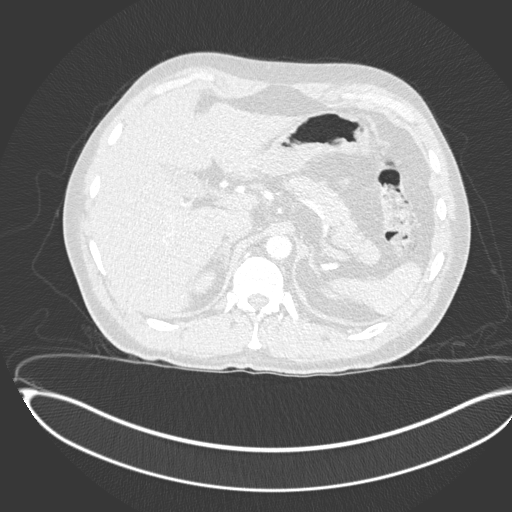
[im 35/311  soft-tissue]
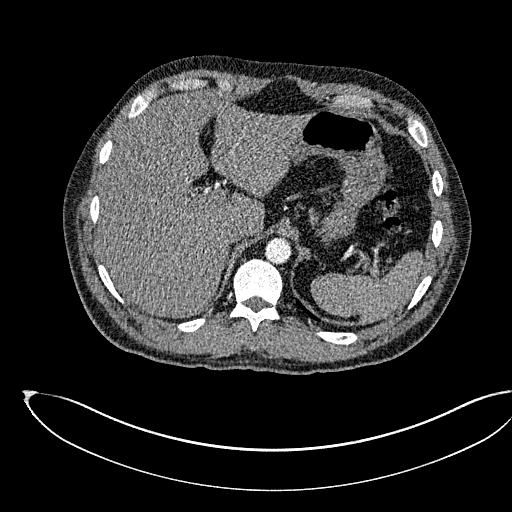
[im 52/311  lung]
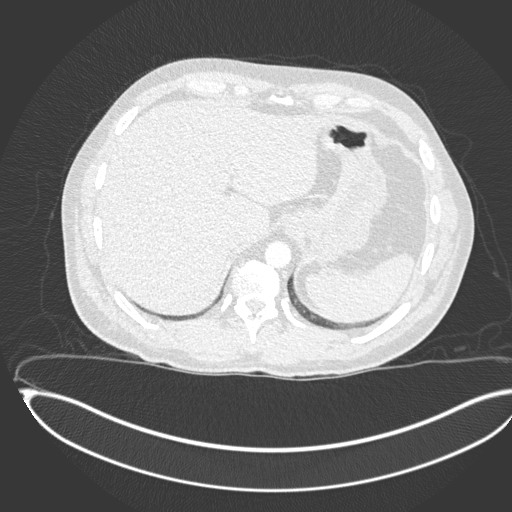
[im 69/311  soft-tissue]
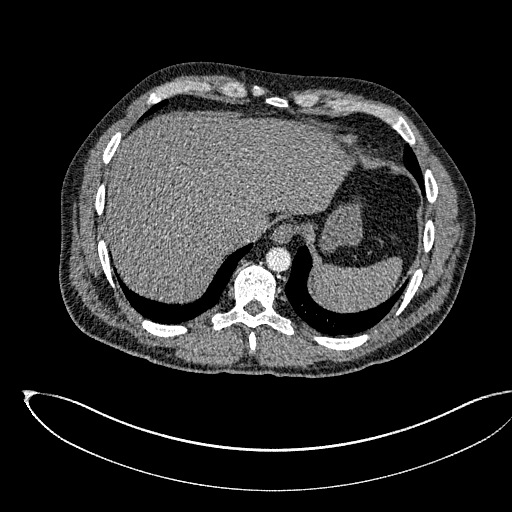
[im 87/311  lung]
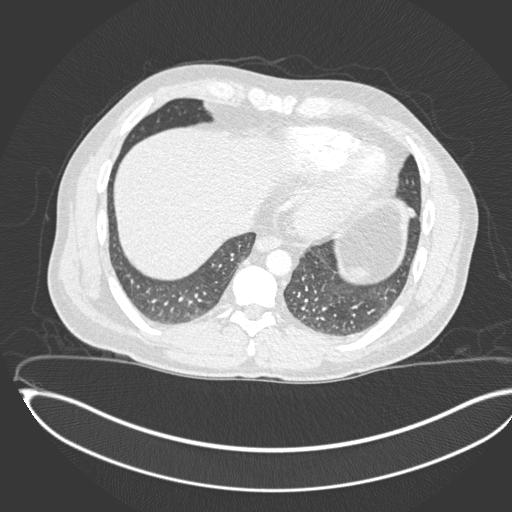
[im 104/311  soft-tissue]
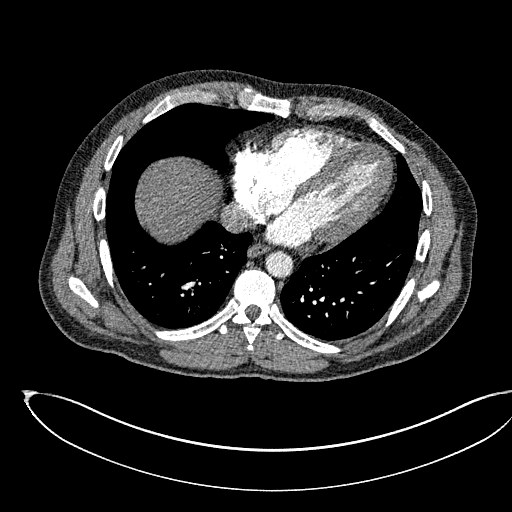
[im 121/311  lung]
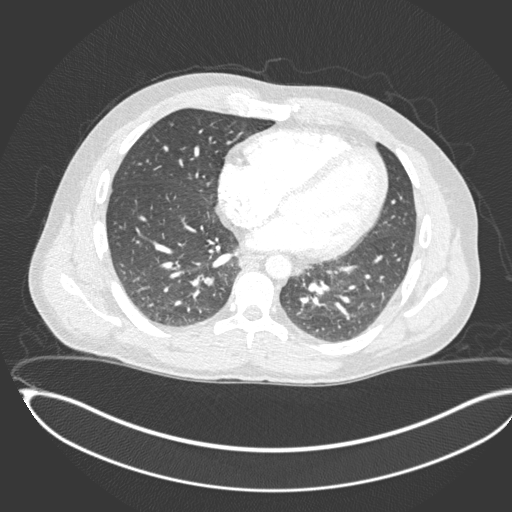
[im 138/311  soft-tissue]
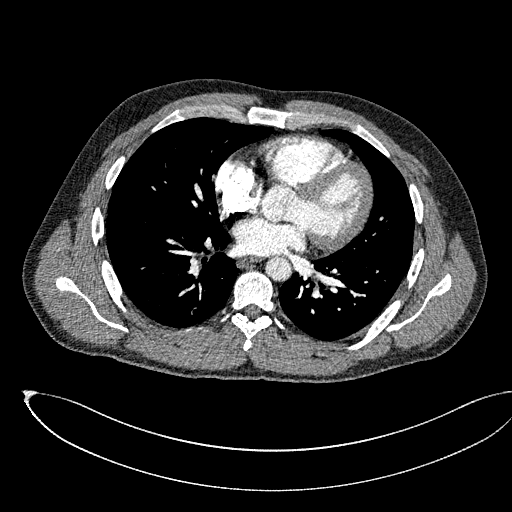
[im 173/311  lung]
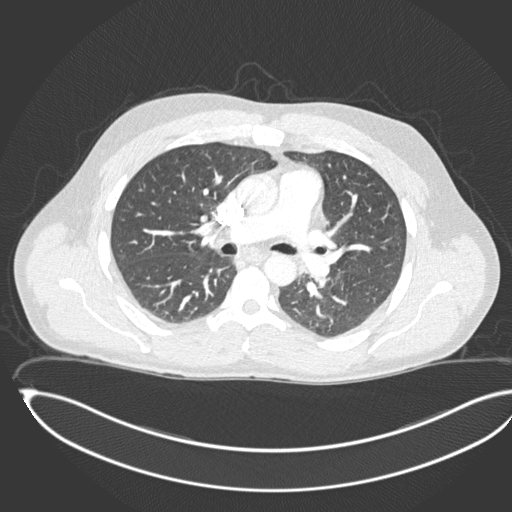
[im 190/311  soft-tissue]
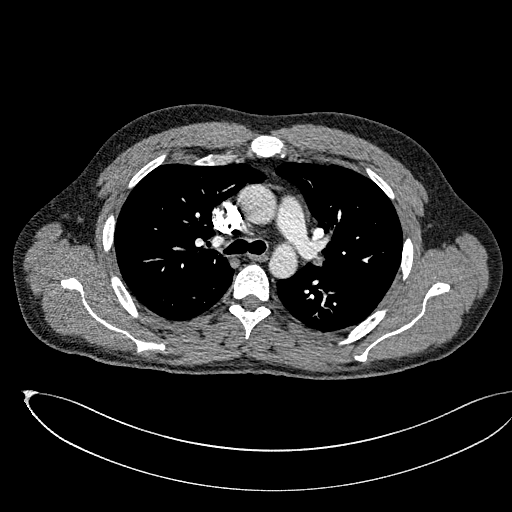
[im 207/311  lung]
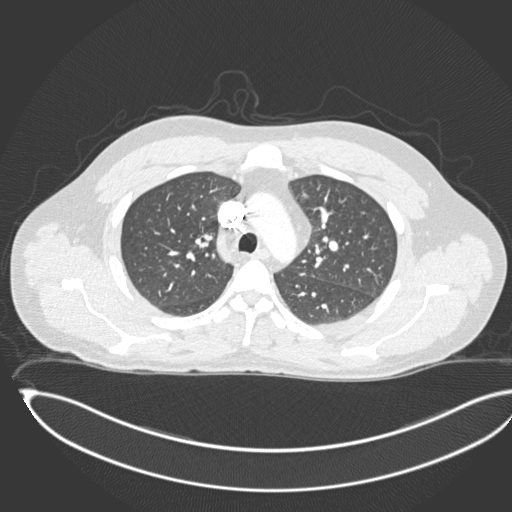
[im 224/311  soft-tissue]
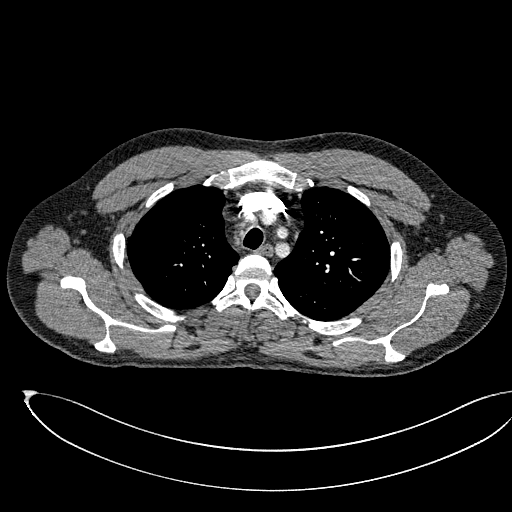
[im 242/311  lung]
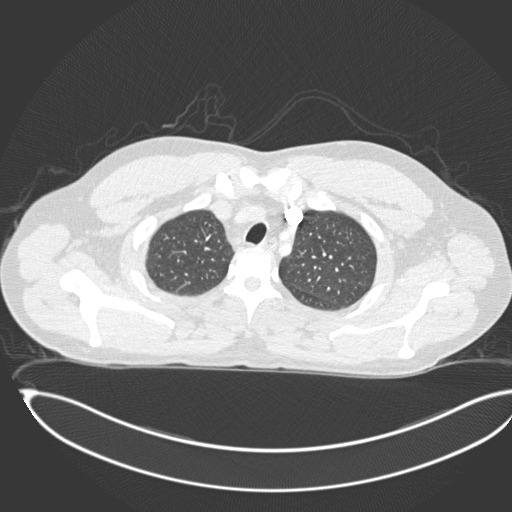
[im 259/311  soft-tissue]
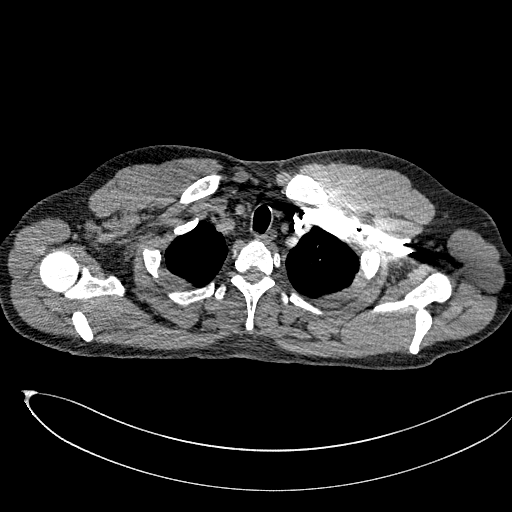
[im 276/311  lung]
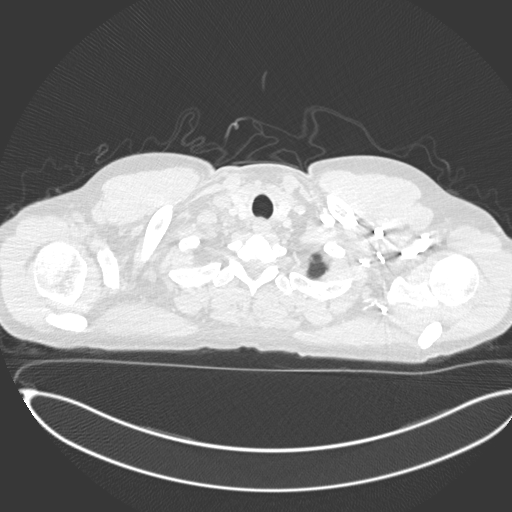
[im 293/311  soft-tissue]
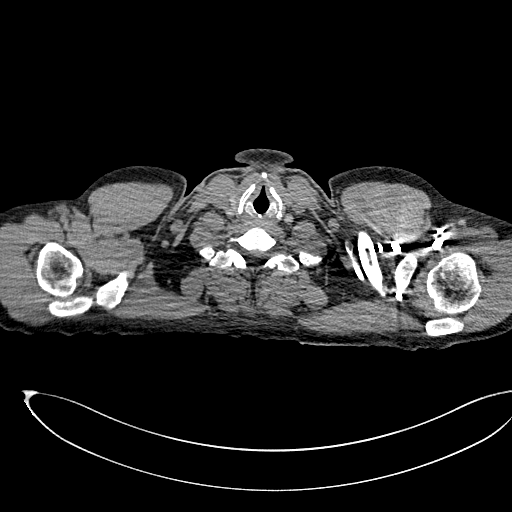

[Series 10: coronal mpr · coronal · 0.67mm/px · 3 of 134 slices shown]
[im 34/134  soft-tissue]
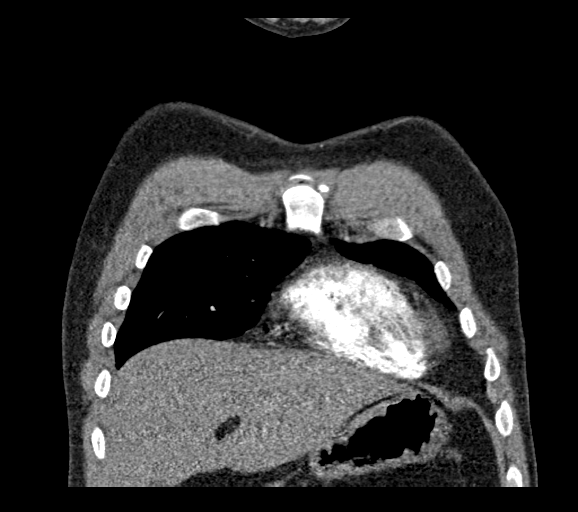
[im 67/134  soft-tissue]
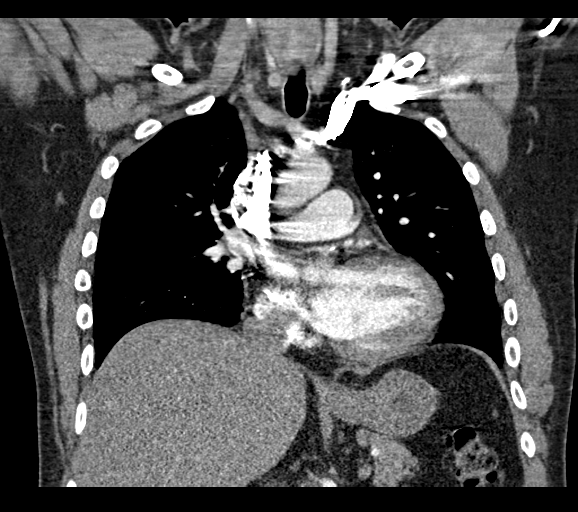
[im 100/134  soft-tissue]
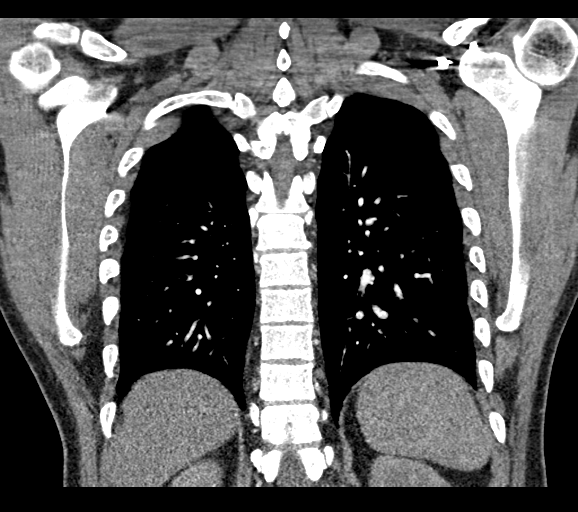

[19 of 46 positions shown; findings below may reference images not displayed]

RADIATION DOSE REDUCTION: This exam was performed according to the
departmental dose-optimization program which includes automated
exposure control, adjustment of the mA and/or kV according to
patient size and/or use of iterative reconstruction technique.

CONTRAST:  75mL OMNIPAQUE IOHEXOL 350 MG/ML SOLN
FINDINGS: Cardiovascular: Suboptimal opacification of the pulmonary arteries
due to contrast bolus timing. No central or lobar acute pulmonary
embolism. Possible distal segmental and subsegmental emboli right
upper and lower lobes. Normal heart size. No pericardial effusion.

Mediastinum/Nodes: Normal thyroid. No enlarged nodes. Esophagus is
unremarkable.

Lungs/Pleura: No consolidation or mass. No pleural effusion or
pneumothorax.

Upper Abdomen: No acute abnormality.

Musculoskeletal: No chest wall abnormality. No acute or significant
osseous findings.

Review of the MIP images confirms the above findings.
IMPRESSION: Suboptimal contrast bolus timing. No central or lobar acute
pulmonary embolism. Possible distal segmental and subsegmental
emboli right upper and lower lobes could also reflect artifact.

## 2023-09-18 ENCOUNTER — Telehealth: Payer: Self-pay | Admitting: Physician Assistant

## 2023-09-18 NOTE — Telephone Encounter (Signed)
 Rescheduled appointments per provider on PAL. Patient is aware of the changes made and is active on MyChart.

## 2023-10-05 ENCOUNTER — Other Ambulatory Visit: Payer: Self-pay | Admitting: Physician Assistant

## 2023-10-05 DIAGNOSIS — I82461 Acute embolism and thrombosis of right calf muscular vein: Secondary | ICD-10-CM

## 2023-10-06 ENCOUNTER — Inpatient Hospital Stay: Payer: BC Managed Care – PPO | Admitting: Physician Assistant

## 2023-10-06 ENCOUNTER — Inpatient Hospital Stay: Payer: BC Managed Care – PPO | Attending: Physician Assistant

## 2023-10-06 VITALS — BP 130/69 | HR 65 | Temp 98.0°F | Resp 16 | Wt 214.7 lb

## 2023-10-06 DIAGNOSIS — Z7901 Long term (current) use of anticoagulants: Secondary | ICD-10-CM | POA: Insufficient documentation

## 2023-10-06 DIAGNOSIS — Z86711 Personal history of pulmonary embolism: Secondary | ICD-10-CM | POA: Insufficient documentation

## 2023-10-06 DIAGNOSIS — Z809 Family history of malignant neoplasm, unspecified: Secondary | ICD-10-CM | POA: Diagnosis not present

## 2023-10-06 DIAGNOSIS — I82461 Acute embolism and thrombosis of right calf muscular vein: Secondary | ICD-10-CM

## 2023-10-06 DIAGNOSIS — Z86718 Personal history of other venous thrombosis and embolism: Secondary | ICD-10-CM | POA: Insufficient documentation

## 2023-10-06 DIAGNOSIS — Z87891 Personal history of nicotine dependence: Secondary | ICD-10-CM | POA: Insufficient documentation

## 2023-10-06 LAB — CBC WITH DIFFERENTIAL (CANCER CENTER ONLY)
Abs Immature Granulocytes: 0.01 10*3/uL (ref 0.00–0.07)
Basophils Absolute: 0.1 10*3/uL (ref 0.0–0.1)
Basophils Relative: 1 %
Eosinophils Absolute: 0.2 10*3/uL (ref 0.0–0.5)
Eosinophils Relative: 3 %
HCT: 45.2 % (ref 39.0–52.0)
Hemoglobin: 15.3 g/dL (ref 13.0–17.0)
Immature Granulocytes: 0 %
Lymphocytes Relative: 37 %
Lymphs Abs: 2.3 10*3/uL (ref 0.7–4.0)
MCH: 29.8 pg (ref 26.0–34.0)
MCHC: 33.8 g/dL (ref 30.0–36.0)
MCV: 87.9 fL (ref 80.0–100.0)
Monocytes Absolute: 0.6 10*3/uL (ref 0.1–1.0)
Monocytes Relative: 10 %
Neutro Abs: 3 10*3/uL (ref 1.7–7.7)
Neutrophils Relative %: 49 %
Platelet Count: 340 10*3/uL (ref 150–400)
RBC: 5.14 MIL/uL (ref 4.22–5.81)
RDW: 11.7 % (ref 11.5–15.5)
WBC Count: 6.2 10*3/uL (ref 4.0–10.5)
nRBC: 0 % (ref 0.0–0.2)

## 2023-10-06 LAB — CMP (CANCER CENTER ONLY)
ALT: 37 U/L (ref 0–44)
AST: 21 U/L (ref 15–41)
Albumin: 5.1 g/dL — ABNORMAL HIGH (ref 3.5–5.0)
Alkaline Phosphatase: 73 U/L (ref 38–126)
Anion gap: 7 (ref 5–15)
BUN: 12 mg/dL (ref 6–20)
CO2: 27 mmol/L (ref 22–32)
Calcium: 10 mg/dL (ref 8.9–10.3)
Chloride: 107 mmol/L (ref 98–111)
Creatinine: 0.98 mg/dL (ref 0.61–1.24)
GFR, Estimated: >60 mL/min (ref >60)
Glucose, Bld: 106 mg/dL — ABNORMAL HIGH (ref 70–99)
Potassium: 4.2 mmol/L (ref 3.5–5.1)
Sodium: 141 mmol/L (ref 135–145)
Total Bilirubin: 0.7 mg/dL (ref 0.0–1.2)
Total Protein: 7.8 g/dL (ref 6.5–8.1)

## 2023-10-06 NOTE — Progress Notes (Signed)
 [facility] [phone]   [phone]  PROGRESS NOTE  Patient Care Team: Avanell Shackleton, NP-C as PCP - General (Family Medicine)  Hematological/Oncological History # Family History of Factor V Leiden # Unprovoked Right Lower Extremity DVT  10/06/2021: Patient underwent a right lower extremity ultrasound which showed an acute deep vein thrombosis involving the right  popliteal vein, right posterior tibial veins, and right peroneal veins.  Same-day CT PE study showed no evidence of pulmonary embolism. 12/22/2021: Establish care with Dr. Leonides Schanz  Interval History:  Gabriel Burgess 43 y.o. 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 The patient's last visit was on 04/24/2023.  In the interim since the last visit he has continued on Xarelto therapy as prescribed  On exam today Gabriel Burgess reports he has had no major changes in his health in the interim since our last visit. He is tolerating Xarelto therapy without any signs of easy bruising or overt bleeding. He denies any signs or symptoms of a DVT/PE including no edema, shortness of breath or chest. His energy and appetite are unchanged. Overall he feels well and has no questions or concerns regarding anticoagulation therapy.  He denies any fevers, chills, sweats, nausea vomiting or diarrhea. A full 10 point ROS was otherwise negative.   MEDICAL HISTORY:  Past Medical History:  Diagnosis Date   Allergy    DVT (deep venous thrombosis) (HCC)    right leg   Encounter for general adult medical examination with abnormal findings 01/02/2023   Witnessed episode of apnea 01/02/2023    SURGICAL HISTORY: Past Surgical History:  Procedure Laterality Date   WISDOM TOOTH EXTRACTION      SOCIAL HISTORY: Social History   Socioeconomic History   Marital status: Married    Spouse name: Not on file   Number  of children: Not on file   Years of education: Not on file   Highest education level: Bachelor's degree (e.g., BA, AB, BS)  Occupational History   Not on file  Tobacco Use   Smoking status: Former    Current packs/day: 0.50    Types: Cigarettes    Passive exposure: Past   Smokeless tobacco: Never  Vaping Use   Vaping status: Never Used  Substance and Sexual Activity   Alcohol use: Yes    Alcohol/week: 4.0 standard drinks of alcohol    Types: 2 Glasses of wine, 2 Standard drinks or equivalent per week    Comment: occassionally   Drug use: Not Currently   Sexual activity: Yes    Partners: Female    Birth control/protection: None  Other Topics Concern   Not on file  Social History Narrative   Not on file   Social Drivers of Health   Financial Resource Strain: Low Risk  (07/14/2023)   Overall Financial Resource Strain (CARDIA)    Difficulty of Paying Living Expenses: Not very hard  Food Insecurity: No Food Insecurity (07/14/2023)   Hunger Vital Sign    Worried About Running Out of Food in the Last Year: Never true    Ran Out of Food in the Last Year: Never true  Transportation Needs: No Transportation Needs (07/14/2023)   PRAPARE - Administrator, Civil Service (Medical): No    Lack of Transportation (Non-Medical): No  Physical Activity: Sufficiently Active (07/14/2023)   Exercise Vital Sign    Days of Exercise per Week:  5 days    Minutes of Exercise per Session: 30 min  Stress: Stress Concern Present (07/14/2023)   [employer] of Occupational Health - Occupational Stress Questionnaire    Feeling of Stress : To some extent  Social Connections: Moderately Isolated (07/14/2023)   Social Connection and Isolation Panel [NHANES]    Frequency of Communication with Friends and Family: More than three times a week    Frequency of Social Gatherings with Friends and Family: Once a week    Attends Religious Services: Never    Database administrator or Organizations: No     Attends Engineer, structural: Not on file    Marital Status: Married  Intimate Partner Violence: Unknown (09/26/2022)   Received from [employer], [facility]   HITS    Physically Hurt: Not on file    Insult or Talk Down To: Not on file    Threaten Physical Harm: Not on file    Scream or Curse: Not on file    FAMILY HISTORY: Family History  Problem Relation Age of Onset   Hearing loss Mother    Anxiety disorder Father    Sleep apnea Father    Stroke Maternal Grandmother    AAA (abdominal aortic aneurysm) Maternal Grandfather    Cancer Paternal Grandmother    Cerebral aneurysm Paternal Grandfather     ALLERGIES:  is allergic to amoxicillin.  MEDICATIONS:  Current Outpatient Medications  Medication Sig Dispense Refill   atorvastatin (LIPITOR) 20 MG tablet Take 1 tablet (20 mg total) by mouth daily. 90 tablet 3   XARELTO 10 MG TABS tablet TAKE 1 TABLET BY MOUTH EVERY DAY 90 tablet 3   No current facility-administered medications for this visit.    REVIEW OF SYSTEMS:   Constitutional: ( - ) fevers, ( - )  chills , ( - ) night sweats Eyes: ( - ) blurriness of vision, ( - ) double vision, ( - ) watery eyes Ears, nose, mouth, throat, and face: ( - ) mucositis, ( - ) sore throat Respiratory: ( - ) cough, ( - ) dyspnea, ( - ) wheezes Cardiovascular: ( - ) palpitation, ( - ) chest discomfort, ( - ) lower extremity swelling Gastrointestinal:  ( - ) nausea, ( - ) heartburn, ( - ) change in bowel habits Skin: ( - ) abnormal skin rashes Lymphatics: ( - ) new lymphadenopathy, ( - ) easy bruising Neurological: ( - ) numbness, ( - ) tingling, ( - ) new weaknesses Behavioral/Psych: ( - ) mood change, ( - ) new changes  All other systems were reviewed with the patient and are negative.  PHYSICAL EXAMINATION:  Vitals:   10/06/23 1059  BP: 130/69  Pulse: 65  Resp: 16  Temp: 98 F (36.7 C)  SpO2: 100%    Filed Weights   10/06/23 1059  Weight: 214 lb 11.2 oz  (97.4 kg)     GENERAL: Well-appearing middle-age Caucasian male, alert, no distress and comfortable SKIN: skin color, texture, turgor are normal, no rashes or significant lesions EYES: conjunctiva are pink and non-injected, sclera clear LUNGS: clear to auscultation and percussion with normal breathing effort HEART: regular rate & rhythm and no murmurs and no lower extremity edema Musculoskeletal: no cyanosis of digits and no clubbing  PSYCH: alert & oriented x 3, fluent speech NEURO: no focal motor/sensory deficits  LABORATORY DATA:  I have reviewed the data as listed    Latest Ref Rng & Units 10/06/2023   10:34  AM 04/24/2023    7:57 AM 01/02/2023    9:12 AM  CBC  WBC 4.0 - 10.5 K/uL 6.2  6.7  5.9   Hemoglobin 13.0 - 17.0 g/dL 16.1  09.6  04.5   Hematocrit 39.0 - 52.0 % 45.2  45.4  46.0   Platelets 150 - 400 K/uL 340  353  354.0        Latest Ref Rng & Units 10/06/2023   10:34 AM 04/24/2023    7:57 AM 01/02/2023    9:12 AM  CMP  Glucose 70 - 99 mg/dL 409  811  98   BUN 6 - 20 mg/dL 12  15  12    Creatinine 0.61 - 1.24 mg/dL 9.14  7.82  9.56   Sodium 135 - 145 mmol/L 141  140  142   Potassium 3.5 - 5.1 mmol/L 4.2  4.6  4.3   Chloride 98 - 111 mmol/L 107  105  104   CO2 22 - 32 mmol/L 27  28  28    Calcium 8.9 - 10.3 mg/dL 21.3  08.6  9.9   Total Protein 6.5 - 8.1 g/dL 7.8  7.8  7.7   Total Bilirubin 0.0 - 1.2 mg/dL 0.7  0.9  0.7   Alkaline Phos 38 - 126 U/L 73  81  77   AST 15 - 41 U/L 21  23  23    ALT 0 - 44 U/L 37  39  37     RADIOGRAPHIC STUDIES: No results found.  ASSESSMENT & PLAN Gabriel Burgess is a 43 y.o. 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     # Unprovoked DVT/Pulmonary Embolism  --findings at this time are consistent with a unprovoked VTE  -- At each visit will order CMP and CBC to assure labs are adequate for DOAC therapy  --ruled out APS with  anticardiolipin and anti beta2 glycoprotein antibodies.  Lupus anticoagulant panel would be altered by presence of blood thinner, will hold on this testing.   --patient denies any bleeding, bruising, or dark stools on this medication. It is well tolerated. No difficulties accessing/affording the medication  -- labs today show WBC 6.2, hemoglobin 15.3, MCV 87.9, and platelets of 340.Creatinine and LFTs in range.  --recommend the patient continue xarelto, currently on maintenance dose 10 mg PO daily.   --RTC in 6 months' time with strict return precautions for overt signs of bleeding.     # Heterozygous for Factor V Leiden #  Family History of Factor V Leiden -- Patient found to be heterozygous for factor V Leiden. -- Patient has a young son.  At this time there is no indication for genetic testing of his child.  Patient notes that his wife is of [place] descent (low risk of being a FVL carrier).  No orders of the defined types were placed in this encounter.   All questions were answered. The patient knows to call the clinic with any problems, questions or concerns.  A total of more than 25 minutes were spent on this encounter with face-to-face time and non-face-to-face time, including preparing to see the patient, ordering tests and/or medications, counseling the patient and coordination of care as outlined above.   Georga Kaufmann PA-C Dept of Hematology and Oncology [facility] Phone: [phone]   10/06/2023 11:32 AM

## 2023-10-19 ENCOUNTER — Other Ambulatory Visit: Payer: BC Managed Care – PPO

## 2023-10-19 ENCOUNTER — Ambulatory Visit: Payer: BC Managed Care – PPO | Admitting: Hematology and Oncology

## 2024-01-06 ENCOUNTER — Ambulatory Visit: Admitting: Family Medicine

## 2024-01-06 ENCOUNTER — Encounter: Payer: Self-pay | Admitting: Family Medicine

## 2024-01-06 VITALS — BP 136/74 | HR 75 | Temp 98.1°F | Ht 72.0 in | Wt 213.6 lb

## 2024-01-06 DIAGNOSIS — J189 Pneumonia, unspecified organism: Secondary | ICD-10-CM | POA: Diagnosis not present

## 2024-01-06 DIAGNOSIS — Z86718 Personal history of other venous thrombosis and embolism: Secondary | ICD-10-CM

## 2024-01-06 DIAGNOSIS — R5383 Other fatigue: Secondary | ICD-10-CM | POA: Diagnosis not present

## 2024-01-06 DIAGNOSIS — R0602 Shortness of breath: Secondary | ICD-10-CM

## 2024-01-06 LAB — CBC WITH DIFFERENTIAL/PLATELET
Basophils Absolute: 0 10*3/uL (ref 0.0–0.1)
Basophils Relative: 0.3 % (ref 0.0–3.0)
Eosinophils Absolute: 0 10*3/uL (ref 0.0–0.7)
Eosinophils Relative: 0 % (ref 0.0–5.0)
HCT: 45.1 % (ref 39.0–52.0)
Hemoglobin: 15.4 g/dL (ref 13.0–17.0)
Lymphocytes Relative: 17.5 % (ref 12.0–46.0)
Lymphs Abs: 1.3 10*3/uL (ref 0.7–4.0)
MCHC: 34.2 g/dL (ref 30.0–36.0)
MCV: 87.2 fl (ref 78.0–100.0)
Monocytes Absolute: 0.2 10*3/uL (ref 0.1–1.0)
Monocytes Relative: 3 % (ref 3.0–12.0)
Neutro Abs: 6 10*3/uL (ref 1.4–7.7)
Neutrophils Relative %: 79.2 % — ABNORMAL HIGH (ref 43.0–77.0)
Platelets: 370 10*3/uL (ref 150.0–400.0)
RBC: 5.17 Mil/uL (ref 4.22–5.81)
RDW: 12.5 % (ref 11.5–15.5)
WBC: 7.6 10*3/uL (ref 4.0–10.5)

## 2024-01-06 LAB — COMPREHENSIVE METABOLIC PANEL WITH GFR
ALT: 45 U/L (ref 0–53)
AST: 21 U/L (ref 0–37)
Albumin: 4.9 g/dL (ref 3.5–5.2)
Alkaline Phosphatase: 80 U/L (ref 39–117)
BUN: 17 mg/dL (ref 6–23)
CO2: 27 meq/L (ref 19–32)
Calcium: 10.1 mg/dL (ref 8.4–10.5)
Chloride: 104 meq/L (ref 96–112)
Creatinine, Ser: 0.92 mg/dL (ref 0.40–1.50)
GFR: 101.81 mL/min (ref >60.00)
Glucose, Bld: 137 mg/dL — ABNORMAL HIGH (ref 70–99)
Potassium: 4.3 meq/L (ref 3.5–5.1)
Sodium: 139 meq/L (ref 135–145)
Total Bilirubin: 0.4 mg/dL (ref 0.2–1.2)
Total Protein: 7.9 g/dL (ref 6.0–8.3)

## 2024-01-06 NOTE — Progress Notes (Signed)
 Acute Office Visit  Subjective:     Patient ID: Gabriel Burgess, male    DOB: 1981-02-07, 43 y.o.   MRN: [id number]  Chief Complaint  Patient presents with   Hospitalization Follow-up    Hospital Follow Up ([facility] Ur[facility] care 01/02/24). Initiated visit due to lingering cough and wheezing (symptoms were present for a week at that point). Listened to lungs and performed x-ray during urgent care visit. Prescribed prednisone, doxycycline, and albuterol inhaler post visit. Has not finished these scripts yet. Notes cough has gotten better but still having waves of fatigue "just feeling off" or "like I'm hung over"    HPI Patient is in today for follow up of recent pneumonia. Was seen at urgent care with [facility] with x-ray that showed left lower lobe and lingular left upper lobe pneumonia. Was treated with doxycycline inhaler. States that he is feeling much better, does still have some cough, fatigue, lingering SOB, but overall improvement. Denies abdominal pain, nausea, vomiting, diarrhea, rash, chills, fever, other symptoms. Medical history as outlined below.  ROS Per HPI      Objective:    BP 136/74   Pulse 75   Temp 98.1 F (36.7 C)   Ht 6' (1.829 m)   Wt 213 lb 9.6 oz (96.9 kg)   SpO2 98%   BMI 28.97 kg/m    Physical Exam Vitals and nursing note reviewed.  Constitutional:      General: He is not in acute distress.    Appearance: Normal appearance.  HENT:     Head: Normocephalic and atraumatic.     Right Ear: External ear normal.     Left Ear: External ear normal.     Nose: Nose normal.     Mouth/Throat:     Mouth: Mucous membranes are moist.     Pharynx: Oropharynx is clear.  Eyes:     Extraocular Movements: Extraocular movements intact.  Cardiovascular:     Rate and Rhythm: Normal rate and regular rhythm.     Pulses: Normal pulses.     Heart sounds: Normal heart sounds.  Pulmonary:     Effort: Pulmonary effort is normal. No  respiratory distress.     Breath sounds: Rhonchi present. No wheezing or rales.     Comments: Mild rhonchi noted left lower lobe, mild cough in office Musculoskeletal:        General: Normal range of motion.     Cervical back: Normal range of motion.     Right lower leg: No edema.     Left lower leg: No edema.  Lymphadenopathy:     Cervical: No cervical adenopathy.  Skin:    General: Skin is warm and dry.  Neurological:     General: No focal deficit present.     Mental Status: He is alert and oriented to person, place, and time.  Psychiatric:        Mood and Affect: Mood normal.        Behavior: Behavior normal.    Results for orders placed or performed in visit on 01/06/24  CBC with Differential/Platelet  Result Value Ref Range   WBC 7.6 4.0 - 10.5 K/uL   RBC 5.17 4.22 - 5.81 Mil/uL   Hemoglobin 15.4 13.0 - 17.0 g/dL   HCT 40.9 81.1 - 91.4 %   MCV 87.2 78.0 - 100.0 fl   MCHC 34.2 30.0 - 36.0 g/dL   RDW 78.2 95.6 - 21.3 %   Platelets 370.0 150.0 -  400.0 K/uL   Neutrophils Relative % 79.2 (H) 43.0 - 77.0 %   Lymphocytes Relative 17.5 12.0 - 46.0 %   Monocytes Relative 3.0 3.0 - 12.0 %   Eosinophils Relative 0.0 0.0 - 5.0 %   Basophils Relative 0.3 0.0 - 3.0 %   Neutro Abs 6.0 1.4 - 7.7 K/uL   Lymphs Abs 1.3 0.7 - 4.0 K/uL   Monocytes Absolute 0.2 0.1 - 1.0 K/uL   Eosinophils Absolute 0.0 0.0 - 0.7 K/uL   Basophils Absolute 0.0 0.0 - 0.1 K/uL  Comprehensive metabolic panel with GFR  Result Value Ref Range   Sodium 139 135 - 145 mEq/L   Potassium 4.3 3.5 - 5.1 mEq/L   Chloride 104 96 - 112 mEq/L   CO2 27 19 - 32 mEq/L   Glucose, Bld 137 (H) 70 - 99 mg/dL   BUN 17 6 - 23 mg/dL   Creatinine, Ser 5.78 0.40 - 1.50 mg/dL   Total Bilirubin 0.4 0.2 - 1.2 mg/dL   Alkaline Phosphatase 80 39 - 117 U/L   AST 21 0 - 37 U/L   ALT 45 0 - 53 U/L   Total Protein 7.9 6.0 - 8.3 g/dL   Albumin 4.9 3.5 - 5.2 g/dL   GFR 469.62 >95.28 mL/min   Calcium  10.1 8.4 - 10.5 mg/dL  D-dimer,  quantitative  Result Value Ref Range   D-Dimer, Quant <0.19 <0.50 mcg/mL FEU        Assessment & Plan:   Other fatigue -     CBC with Differential/Platelet -     Comprehensive metabolic panel with GFR  Community acquired pneumonia of left lung, unspecified part of lung -     CBC with Differential/Platelet -     Comprehensive metabolic panel with GFR  SOB (shortness of breath) -     D-dimer, quantitative  History of DVT (deep vein thrombosis) -     D-dimer, quantitative     No orders of the defined types were placed in this encounter.   Return if symptoms worsen or fail to improve.  Wellington Half, FNP

## 2024-01-07 LAB — D-DIMER, QUANTITATIVE: D-Dimer, Quant: <0.19 ug{FEU}/mL (ref <0.50)

## 2024-01-08 ENCOUNTER — Ambulatory Visit: Payer: Self-pay | Admitting: Family Medicine

## 2024-01-09 ENCOUNTER — Encounter: Payer: Self-pay | Admitting: Family Medicine

## 2024-01-09 NOTE — Patient Instructions (Signed)
 Continue doxycycline course to completion.  Continue inhaler as needed.  We are checking labs today, will be in contact with any results that require further attention  Follow-up with me for new or worsening symptoms.

## 2024-01-26 ENCOUNTER — Other Ambulatory Visit: Payer: Self-pay | Admitting: Family Medicine

## 2024-01-26 DIAGNOSIS — E785 Hyperlipidemia, unspecified: Secondary | ICD-10-CM

## 2024-02-02 ENCOUNTER — Encounter: Payer: Self-pay | Admitting: Family Medicine

## 2024-02-02 DIAGNOSIS — E785 Hyperlipidemia, unspecified: Secondary | ICD-10-CM

## 2024-02-02 MED ORDER — ATORVASTATIN CALCIUM 20 MG PO TABS
ORAL_TABLET | ORAL | 0 refills | Status: DC
Start: 2024-02-02 — End: 2024-02-24

## 2024-02-24 ENCOUNTER — Ambulatory Visit: Payer: Self-pay | Admitting: Family Medicine

## 2024-02-24 ENCOUNTER — Encounter: Payer: Self-pay | Admitting: Family Medicine

## 2024-02-24 ENCOUNTER — Ambulatory Visit: Admitting: Family Medicine

## 2024-02-24 VITALS — BP 128/72 | HR 62 | Temp 97.8°F | Ht 72.0 in | Wt 212.0 lb

## 2024-02-24 DIAGNOSIS — R1319 Other dysphagia: Secondary | ICD-10-CM | POA: Diagnosis not present

## 2024-02-24 DIAGNOSIS — Z7901 Long term (current) use of anticoagulants: Secondary | ICD-10-CM | POA: Diagnosis not present

## 2024-02-24 DIAGNOSIS — D6851 Activated protein C resistance: Secondary | ICD-10-CM

## 2024-02-24 DIAGNOSIS — E538 Deficiency of other specified B group vitamins: Secondary | ICD-10-CM | POA: Diagnosis not present

## 2024-02-24 DIAGNOSIS — D75839 Thrombocytosis, unspecified: Secondary | ICD-10-CM | POA: Diagnosis not present

## 2024-02-24 DIAGNOSIS — E782 Mixed hyperlipidemia: Secondary | ICD-10-CM

## 2024-02-24 DIAGNOSIS — D2271 Melanocytic nevi of right lower limb, including hip: Secondary | ICD-10-CM

## 2024-02-24 DIAGNOSIS — Z0001 Encounter for general adult medical examination with abnormal findings: Secondary | ICD-10-CM | POA: Diagnosis not present

## 2024-02-24 DIAGNOSIS — E785 Hyperlipidemia, unspecified: Secondary | ICD-10-CM

## 2024-02-24 LAB — CBC WITH DIFFERENTIAL/PLATELET
Basophils Absolute: 0 K/uL (ref 0.0–0.1)
Basophils Relative: 0.6 % (ref 0.0–3.0)
Eosinophils Absolute: 0.2 K/uL (ref 0.0–0.7)
Eosinophils Relative: 2.7 % (ref 0.0–5.0)
HCT: 46 % (ref 39.0–52.0)
Hemoglobin: 15.5 g/dL (ref 13.0–17.0)
Lymphocytes Relative: 30.8 % (ref 12.0–46.0)
Lymphs Abs: 2.2 K/uL (ref 0.7–4.0)
MCHC: 33.6 g/dL (ref 30.0–36.0)
MCV: 89.1 fl (ref 78.0–100.0)
Monocytes Absolute: 0.6 K/uL (ref 0.1–1.0)
Monocytes Relative: 8.4 % (ref 3.0–12.0)
Neutro Abs: 4.1 K/uL (ref 1.4–7.7)
Neutrophils Relative %: 57.5 % (ref 43.0–77.0)
Platelets: 329 K/uL (ref 150.0–400.0)
RBC: 5.16 Mil/uL (ref 4.22–5.81)
RDW: 12.7 % (ref 11.5–15.5)
WBC: 7.1 K/uL (ref 4.0–10.5)

## 2024-02-24 LAB — LIPID PANEL
Cholesterol: 173 mg/dL (ref 0–200)
HDL: 47.6 mg/dL (ref >39.00)
LDL Cholesterol: 106 mg/dL — ABNORMAL HIGH (ref 0–99)
NonHDL: 125
Total CHOL/HDL Ratio: 4
Triglycerides: 94 mg/dL (ref 0.0–149.0)
VLDL: 18.8 mg/dL (ref 0.0–40.0)

## 2024-02-24 LAB — COMPREHENSIVE METABOLIC PANEL WITH GFR
ALT: 30 U/L (ref 0–53)
AST: 20 U/L (ref 0–37)
Albumin: 5.1 g/dL (ref 3.5–5.2)
Alkaline Phosphatase: 70 U/L (ref 39–117)
BUN: 15 mg/dL (ref 6–23)
CO2: 27 meq/L (ref 19–32)
Calcium: 9.9 mg/dL (ref 8.4–10.5)
Chloride: 104 meq/L (ref 96–112)
Creatinine, Ser: 0.97 mg/dL (ref 0.40–1.50)
GFR: 95.45 mL/min (ref >60.00)
Glucose, Bld: 108 mg/dL — ABNORMAL HIGH (ref 70–99)
Potassium: 4.2 meq/L (ref 3.5–5.1)
Sodium: 139 meq/L (ref 135–145)
Total Bilirubin: 0.9 mg/dL (ref 0.2–1.2)
Total Protein: 7.7 g/dL (ref 6.0–8.3)

## 2024-02-24 LAB — VITAMIN B12: Vitamin B-12: 688 pg/mL (ref 211–911)

## 2024-02-24 MED ORDER — ATORVASTATIN CALCIUM 20 MG PO TABS: ORAL_TABLET | ORAL | 0 refills | Status: DC

## 2024-02-24 NOTE — Progress Notes (Signed)
 Please ask him if he was fasting this morning. If so, see if we can add a Hgb A1c

## 2024-02-24 NOTE — Progress Notes (Signed)
 Complete physical exam  Patient: Gabriel Burgess   DOB: Aug 19, 1980   43 y.o. Male  MRN: [id number]  Subjective:    Chief Complaint  Patient presents with   Annual Exam    fasting   He is here for a complete physical exam.  He has a new concern today regarding trouble swallowing and food getting stuck in his esophagus.  Denies GERD symptoms. No abdominal pain.  He also has a mole on his right leg that his wife thinks is changing and enlarging.  Would like to see dermatology.  Diagnosed with unprovoked RLE DVT and is on Xarelto . Under the care of Dr. Federico. Denies bleeding issues.    Vitamin B12 def in May 2024. He was getting B12 injections and has been on oral B12 supplements.   HLD- on statin daily    He does not want to pursue a sleep study at this time. States he tried a HST and it did not work.  Reports side sleeping and feels like he is sleeping well.  Fairly healthy diet  Walking for exercise   He is married.  Has a 44-year-old child. 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   Health Maintenance  Topic Date Due   Hepatitis B Vaccine (1 of 3 - 19+ 3-dose series) Never done   HPV Vaccine (1 - Risk 3-dose SCDM series) Never done   COVID-19 Vaccine (5 - 2024-25 season) 03/11/2024*   Flu Shot  03/11/2024   DTaP/Tdap/Td vaccine (2 - Td or Tdap) 04/15/2030   Hepatitis C Screening  Completed   HIV Screening  Completed   Meningitis B Vaccine  Aged Out  *Topic was postponed. The date shown is not the original due date.    Wears seatbelt always, uses sunscreen, smoke detectors in home and functioning, does not text while driving, feels safe in home environment.  Depression screening:    02/24/2024    9:43 AM 07/16/2023   10:05 AM 12/12/2022    8:08 AM  Depression screen PHQ 2/9  Decreased Interest 0 0 0  Down, Depressed, Hopeless 0 0 0  PHQ - 2 Score 0 0 0   Anxiety Screening:     No data to display          Vision:Within last year and Dental: No current dental problems and  Receives regular dental care  Patient Active Problem List   Diagnosis Date Noted   Esophageal dysphagia 02/24/2024   B12 deficiency 07/16/2023   Snores 01/02/2023   Hearing decreased, bilateral 01/02/2023   Witnessed episode of apnea 01/02/2023   Bilateral cold feet 01/02/2023   Thrombocytosis 01/02/2023   Encounter for general adult medical examination with abnormal findings 01/02/2023   Factor V Leiden (HCC) 12/12/2022   On anticoagulant therapy 12/12/2022   Chronic bilateral low back pain without sciatica 12/12/2022   Hyperlipidemia 11/01/2021   Acute deep vein thrombosis (DVT) of right peroneal vein (HCC) 10/08/2021   Past Medical History:  Diagnosis Date   Allergy    DVT (deep venous thrombosis) (HCC)    right leg   Encounter for general adult medical examination with abnormal findings 01/02/2023   Witnessed episode of apnea 01/02/2023   Past Surgical History:  Procedure Laterality Date   WISDOM TOOTH EXTRACTION     Social History   Tobacco Use   Smoking status: Former    Current packs/day: 0.50    Types: Cigarettes    Passive exposure: Past   Smokeless tobacco: Never  Vaping Use   Vaping  status: Never Used  Substance Use Topics   Alcohol use: Yes    Alcohol/week: 4.0 standard drinks of alcohol    Types: 2 Glasses of wine, 2 Standard drinks or equivalent per week    Comment: occassionally   Drug use: Not Currently      Patient Care Team: Lendia Boby CROME, NP-C as PCP - General (Family Medicine)   Outpatient Medications Prior to Visit  Medication Sig   atorvastatin  (LIPITOR) 20 MG tablet TAKE 1 TABLET BY MOUTH EVERY DAY. Please schedule fasting follow up for refills.   XARELTO  10 MG TABS tablet TAKE 1 TABLET BY MOUTH EVERY DAY   No facility-administered medications prior to visit.    Review of Systems  Constitutional:  Negative for chills, fever, malaise/fatigue and weight loss.  HENT:  Negative for congestion, ear pain, sinus pain and sore throat.    Eyes:  Negative for blurred vision, double vision and pain.  Respiratory:  Negative for cough, shortness of breath and wheezing.   Cardiovascular:  Negative for chest pain, palpitations and leg swelling.  Gastrointestinal:  Negative for abdominal pain, blood in stool, constipation, diarrhea, heartburn, nausea and vomiting.       Food gets stuck, specifically meat.  Becoming more frequent  Genitourinary:  Negative for dysuria, frequency and urgency.  Musculoskeletal:  Negative for back pain, joint pain and myalgias.  Skin:  Negative for rash.       Enlarging mole on right lower leg  Neurological:  Negative for dizziness, tingling, focal weakness and headaches.  Endo/Heme/Allergies:  Does not bruise/bleed easily.  Psychiatric/Behavioral:  Negative for depression. The patient is not nervous/anxious.        Objective:    BP 128/72 (BP Location: Left Arm, Patient Position: Sitting)   Pulse 62   Temp 97.8 F (36.6 C) (Temporal)   Ht 6' (1.829 m)   Wt 212 lb (96.2 kg)   SpO2 100%   BMI 28.75 kg/m  BP Readings from Last 3 Encounters:  02/24/24 128/72  01/06/24 136/74  10/06/23 130/69   Wt Readings from Last 3 Encounters:  02/24/24 212 lb (96.2 kg)  01/06/24 213 lb 9.6 oz (96.9 kg)  10/06/23 214 lb 11.2 oz (97.4 kg)    Physical Exam Constitutional:      General: He is not in acute distress.    Appearance: He is not ill-appearing.  HENT:     Right Ear: Tympanic membrane, ear canal and external ear normal.     Left Ear: Tympanic membrane, ear canal and external ear normal.     Nose: Nose normal.     Mouth/Throat:     Mouth: Mucous membranes are moist.     Pharynx: Oropharynx is clear.  Eyes:     Extraocular Movements: Extraocular movements intact.     Conjunctiva/sclera: Conjunctivae normal.     Pupils: Pupils are equal, round, and reactive to light.  Neck:     Thyroid : No thyroid  mass, thyromegaly or thyroid  tenderness.  Cardiovascular:     Rate and Rhythm: Normal rate  and regular rhythm.     Pulses: Normal pulses.     Heart sounds: Normal heart sounds.  Pulmonary:     Effort: Pulmonary effort is normal.     Breath sounds: Normal breath sounds.  Abdominal:     General: Bowel sounds are normal. There is no distension.     Palpations: Abdomen is soft.     Tenderness: There is no abdominal tenderness. There is no right  CVA tenderness, left CVA tenderness, guarding or rebound.  Musculoskeletal:        General: Normal range of motion.     Cervical back: Normal range of motion and neck supple. No tenderness.     Right lower leg: No edema.     Left lower leg: No edema.  Lymphadenopathy:     Cervical: No cervical adenopathy.  Skin:    General: Skin is warm and dry.     Findings: Lesion present. No rash.     Comments: Approx 0.5 cm, round, brown nevus on right lateral lower leg  Neurological:     General: No focal deficit present.     Mental Status: He is alert and oriented to person, place, and time.     Cranial Nerves: No cranial nerve deficit.     Sensory: No sensory deficit.     Motor: No weakness.  Psychiatric:        Mood and Affect: Mood normal.        Behavior: Behavior normal.        Thought Content: Thought content normal.      No results found for any visits on 02/24/24.    Assessment & Plan:    Routine Health Maintenance and Physical Exam  Problem List Items Addressed This Visit     B12 deficiency   Relevant Orders   Vitamin B12   Encounter for general adult medical examination with abnormal findings - Primary   Esophageal dysphagia   Relevant Orders   Ambulatory referral to Gastroenterology   Factor V Leiden (HCC)   Hyperlipidemia   Relevant Orders   Lipid panel   On anticoagulant therapy   Relevant Orders   CBC with Differential/Platelet   Comprehensive metabolic panel with GFR   Thrombocytosis   Relevant Orders   CBC with Differential/Platelet   Comprehensive metabolic panel with GFR   Other Visit Diagnoses        Nevus of right lower leg       Relevant Orders   Ambulatory referral to Dermatology      Preventive health care reviewed.  Counseling on healthy lifestyle including diet and exercise.  Recommend regular dental and eye exams.  Immunizations reviewed.  Discussed safety. Continue follow-up with hematology as recommended.   Assessed for bleeding and no issues with anticoagulant. Continue statin therapy.  Check fasting lipids today.  Continue low-fat diet and getting adequate exercise. Check B12 level and follow-up Referral to GI for dysphagia that is worsening.  Will need nonemergent EGD for evaluation most likely.  In the meantime recommend small bites and avoiding meats.  Stay hydrated. Referral to dermatology for evaluation due to concern of enlarging nevus on right lower leg.  Return in about 6 months (around 08/26/2024) for Fasting follow up.     Boby Mackintosh, NP-C

## 2024-03-14 ENCOUNTER — Encounter: Payer: Self-pay | Admitting: Family Medicine

## 2024-03-15 NOTE — Procedures (Signed)
 SABRA

## 2024-03-17 ENCOUNTER — Ambulatory Visit

## 2024-03-17 ENCOUNTER — Encounter: Payer: Self-pay | Admitting: Family Medicine

## 2024-03-17 ENCOUNTER — Ambulatory Visit: Admitting: Family Medicine

## 2024-03-17 ENCOUNTER — Ambulatory Visit: Payer: Self-pay | Admitting: Family Medicine

## 2024-03-17 VITALS — BP 122/68 | HR 70 | Temp 97.6°F | Ht 72.0 in | Wt 213.0 lb

## 2024-03-17 DIAGNOSIS — Z7901 Long term (current) use of anticoagulants: Secondary | ICD-10-CM | POA: Diagnosis not present

## 2024-03-17 DIAGNOSIS — D6851 Activated protein C resistance: Secondary | ICD-10-CM

## 2024-03-17 DIAGNOSIS — Z8701 Personal history of pneumonia (recurrent): Secondary | ICD-10-CM | POA: Diagnosis not present

## 2024-03-17 DIAGNOSIS — R0602 Shortness of breath: Secondary | ICD-10-CM

## 2024-03-17 MED ORDER — AZITHROMYCIN 250 MG PO TABS: ORAL_TABLET | ORAL | 0 refills | Status: AC

## 2024-03-17 MED ORDER — PREDNISONE 20 MG PO TABS
40.0000 mg | ORAL_TABLET | Freq: Every day | ORAL | 0 refills | Status: DC
Start: 2024-03-17 — End: 2024-04-13

## 2024-03-17 NOTE — Progress Notes (Signed)
 Subjective:     Patient ID: Gabriel Burgess, male    DOB: [birth year], 43 y.o.   MRN: [id number]  Chief Complaint  Patient presents with   Follow-up    Pneumonia 2 months ago, having troubles with taking full breaths. Noticed it started back 3-4 weeks ago before physical for a few days but went away. Friday it returned, comes and goes throughout the day    HPI  Discussed the use of AI scribe software for clinical note transcription with the patient, who gave verbal consent to proceed.  History of Present Illness Gabriel Burgess is a 43 year old male who presents with shortness of breath following a recent pneumonia diagnosis.  Dyspnea - Intermittent shortness of breath throughout the day - Symptoms improve at night and are better in the morning - Dyspnea worsens as the day progresses - Air conditioning provides symptomatic relief - Feels unable to 'get a full breath' - No chest tightness or wheezing - No recent cough - No chest pain or palpitations - No fever or sweating - Remains mostly functional and does not notice shortness of breath when occupied with activities  Recent pulmonary infection - Recent diagnosis of pneumonia - Completed a course of doxycycline for pneumonia  Medication use and response - Uses albuterol inhaler, uncertain of effectiveness - Takes Xarelto , a statin, and B12 supplements  Relevant medical history and testing - No history of asthma or chronic bronchitis - Previous blood work was normal - D-dimer test was negative    Health Maintenance Due  Topic Date Due   Hepatitis B Vaccines (1 of 3 - 19+ 3-dose series) Never done   HPV VACCINES (1 - Risk 3-dose SCDM series) Never done   INFLUENZA VACCINE  03/11/2024    Past Medical History:  Diagnosis Date   Allergy    DVT (deep venous thrombosis) (HCC)    right leg   Encounter for general adult medical examination with abnormal findings 01/02/2023   Witnessed episode of apnea 01/02/2023     Past Surgical History:  Procedure Laterality Date   WISDOM TOOTH EXTRACTION      Family History  Problem Relation Age of Onset   Hearing loss Mother    Anxiety disorder Father    Sleep apnea Father    Stroke Maternal Grandmother    AAA (abdominal aortic aneurysm) Maternal Grandfather    Cancer Paternal Grandmother    Cerebral aneurysm Paternal Grandfather     Social History   Socioeconomic History   Marital status: Married    Spouse name: Not on file   Number of children: Not on file   Years of education: Not on file   Highest education level: Bachelor's degree (e.g., BA, AB, BS)  Occupational History   Not on file  Tobacco Use   Smoking status: Former    Current packs/day: 0.50    Types: Cigarettes    Passive exposure: Past   Smokeless tobacco: Never  Vaping Use   Vaping status: Never Used  Substance and Sexual Activity   Alcohol use: Yes    Alcohol/week: 4.0 standard drinks of alcohol    Types: 2 Glasses of wine, 2 Standard drinks or equivalent per week    Comment: occassionally   Drug use: Not Currently   Sexual activity: Yes    Partners: Female    Birth control/protection: None  Other Topics Concern   Not on file  Social History Narrative   Not on file   Social Drivers of  Health   Financial Resource Strain: Low Risk  (02/22/2024)   Overall Financial Resource Strain (CARDIA)    Difficulty of Paying Living Expenses: Not very hard  Food Insecurity: No Food Insecurity (02/22/2024)   Hunger Vital Sign    Worried About Running Out of Food in the Last Year: Never true    Ran Out of Food in the Last Year: Never true  Transportation Needs: No Transportation Needs (02/22/2024)   PRAPARE - Administrator, Civil Service (Medical): No    Lack of Transportation (Non-Medical): No  Physical Activity: Insufficiently Active (02/22/2024)   Exercise Vital Sign    Days of Exercise per Week: 4 days    Minutes of Exercise per Session: 30 min  Stress: Stress  Concern Present (02/22/2024)   [employer] of Occupational Health - Occupational Stress Questionnaire    Feeling of Stress: To some extent  Social Connections: Moderately Isolated (02/22/2024)   Social Connection and Isolation Panel    Frequency of Communication with Friends and Family: More than three times a week    Frequency of Social Gatherings with Friends and Family: Once a week    Attends Religious Services: Patient declined    Database administrator or Organizations: No    Attends Engineer, structural: Not on file    Marital Status: Married  Intimate Partner Violence: Unknown (09/26/2022)   Received from [facility]   HITS    Physically Hurt: Not on file    Insult or Talk Down To: Not on file    Threaten Physical Harm: Not on file    Scream or Curse: Not on file    Outpatient Medications Prior to Visit  Medication Sig Dispense Refill   atorvastatin  (LIPITOR) 20 MG tablet TAKE 1 TABLET BY MOUTH EVERY DAY. 90 tablet 0   XARELTO  10 MG TABS tablet TAKE 1 TABLET BY MOUTH EVERY DAY 90 tablet 3   No facility-administered medications prior to visit.    Allergies  Allergen Reactions   Amoxicillin     ROS Per HPI    Objective:    Physical Exam Constitutional:      General: He is not in acute distress.    Appearance: He is not ill-appearing.  HENT:     Nose: Nose normal.     Mouth/Throat:     Mouth: Mucous membranes are moist.     Pharynx: Oropharynx is clear.  Eyes:     Extraocular Movements: Extraocular movements intact.     Conjunctiva/sclera: Conjunctivae normal.  Cardiovascular:     Rate and Rhythm: Normal rate and regular rhythm.  Pulmonary:     Effort: Pulmonary effort is normal.     Breath sounds: Examination of the left-upper field reveals wheezing. Examination of the left-middle field reveals rhonchi. Examination of the left-lower field reveals rhonchi. Wheezing and rhonchi present.  Musculoskeletal:     Cervical back: Normal range of  motion and neck supple.     Right lower leg: No edema.     Left lower leg: No edema.  Skin:    General: Skin is warm and dry.  Neurological:     General: No focal deficit present.     Mental Status: He is alert and oriented to person, place, and time.     Motor: No weakness.     Coordination: Coordination normal.     Gait: Gait normal.  Psychiatric:        Mood and Affect: Mood normal.  Behavior: Behavior normal.        Thought Content: Thought content normal.      BP 122/68   Pulse 70   Temp 97.6 F (36.4 C) (Temporal)   Ht 6' (1.829 m)   Wt 213 lb (96.6 kg)   SpO2 98%   BMI 28.89 kg/m  Wt Readings from Last 3 Encounters:  03/17/24 213 lb (96.6 kg)  02/24/24 212 lb (96.2 kg)  01/06/24 213 lb 9.6 oz (96.9 kg)       Assessment & Plan:   Problem List Items Addressed This Visit     Factor V Leiden (HCC)   On anticoagulant therapy   Other Visit Diagnoses       SOB (shortness of breath)    -  Primary   Relevant Orders   CBC with Differential/Platelet   DG Chest 2 View (Completed)     History of pneumonia       Relevant Orders   DG Chest 2 View (Completed)      Stat chest x-ray and CBC ordered.  Assessment and Plan Assessment & Plan Shortness of breath Intermittent shortness of breath, primarily during the day, improving at night. No asthma or chronic bronchitis. No cough, fever, or chest pain. Wheezing on the left side where previous pneumonia was located. Differential includes unresolved pneumonia or possible late-onset asthma. Anxiety may exacerbate symptoms. Improved in air-conditioned environments and with distraction. - Order chest x-ray and CBC to evaluate current lung status due to abnormal lung sounds on left side.  - Refer to pulmonology for pulmonary function tests if needed.  - Instruct on proper use of albuterol inhaler as needed  Pneumonia, left lower lobe, recent Pneumonia diagnosed in May, treated with doxycycline and albuterol.  Symptoms improved but not fully resolved. Wheezing and rhonchi persists in the left lower lobe, suggesting possible incomplete resolution. Consider further treatment with steroids and antibiotics if x-ray indicates persistent pneumonia.  Discussed oral steroids versus steroid injection and he prefers oral therapy.  - Order chest x-ray and CBC to assess for pneumonia - Prescribe oral steroids and antibiotics and he will follow up if not back to baseline after completing the medications.      I am having Osmany Alewine start on predniSONE  and azithromycin . I am also having him maintain his Xarelto  and atorvastatin .  Meds ordered this encounter  Medications   predniSONE  (DELTASONE ) 20 MG tablet    Sig: Take 2 tablets (40 mg total) by mouth daily with breakfast.    Dispense:  10 tablet    Refill:  0    Supervising Provider:   ROLLENE NORRIS A [[id number]]   azithromycin  (ZITHROMAX ) 250 MG tablet    Sig: Take 2 tablets on day 1, then 1 tablet daily on days 2 through 5    Dispense:  6 tablet    Refill:  0    Supervising Provider:   ROLLENE NORRIS A [[id number]]

## 2024-03-17 NOTE — Patient Instructions (Signed)
 Please go downstairs for labs and a chest x-ray  I will be in touch with your results  Use your albuterol inhaler as needed

## 2024-03-18 LAB — CBC WITH DIFFERENTIAL/PLATELET
Basophils Absolute: 0.1 K/uL (ref 0.0–0.1)
Basophils Relative: 0.7 % (ref 0.0–3.0)
Eosinophils Absolute: 0.2 K/uL (ref 0.0–0.7)
Eosinophils Relative: 2.6 % (ref 0.0–5.0)
HCT: 44.3 % (ref 39.0–52.0)
Hemoglobin: 15 g/dL (ref 13.0–17.0)
Lymphocytes Relative: 34.5 % (ref 12.0–46.0)
Lymphs Abs: 3 K/uL (ref 0.7–4.0)
MCHC: 33.8 g/dL (ref 30.0–36.0)
MCV: 88.5 fl (ref 78.0–100.0)
Monocytes Absolute: 0.8 K/uL (ref 0.1–1.0)
Monocytes Relative: 8.9 % (ref 3.0–12.0)
Neutro Abs: 4.6 K/uL (ref 1.4–7.7)
Neutrophils Relative %: 53.3 % (ref 43.0–77.0)
Platelets: 348 K/uL (ref 150.0–400.0)
RBC: 5 Mil/uL (ref 4.22–5.81)
RDW: 12.8 % (ref 11.5–15.5)
WBC: 8.6 K/uL (ref 4.0–10.5)

## 2024-03-24 ENCOUNTER — Ambulatory Visit: Admitting: Internal Medicine

## 2024-03-24 ENCOUNTER — Ambulatory Visit: Payer: Self-pay | Admitting: Internal Medicine

## 2024-03-24 VITALS — BP 120/82 | HR 73 | Temp 98.5°F | Ht 72.0 in | Wt 212.0 lb

## 2024-03-24 DIAGNOSIS — R0602 Shortness of breath: Secondary | ICD-10-CM | POA: Insufficient documentation

## 2024-03-24 DIAGNOSIS — Z7901 Long term (current) use of anticoagulants: Secondary | ICD-10-CM | POA: Diagnosis not present

## 2024-03-24 DIAGNOSIS — J069 Acute upper respiratory infection, unspecified: Secondary | ICD-10-CM | POA: Diagnosis not present

## 2024-03-24 DIAGNOSIS — R0609 Other forms of dyspnea: Secondary | ICD-10-CM | POA: Insufficient documentation

## 2024-03-24 DIAGNOSIS — R7303 Prediabetes: Secondary | ICD-10-CM

## 2024-03-24 DIAGNOSIS — Z125 Encounter for screening for malignant neoplasm of prostate: Secondary | ICD-10-CM

## 2024-03-24 LAB — CBC WITH DIFFERENTIAL/PLATELET
Basophils Absolute: 0.1 K/uL (ref 0.0–0.1)
Basophils Relative: 1 % (ref 0.0–3.0)
Eosinophils Absolute: 0.2 K/uL (ref 0.0–0.7)
Eosinophils Relative: 2.3 % (ref 0.0–5.0)
HCT: 45.7 % (ref 39.0–52.0)
Hemoglobin: 15.5 g/dL (ref 13.0–17.0)
Lymphocytes Relative: 31 % (ref 12.0–46.0)
Lymphs Abs: 2.5 K/uL (ref 0.7–4.0)
MCHC: 34 g/dL (ref 30.0–36.0)
MCV: 89.2 fl (ref 78.0–100.0)
Monocytes Absolute: 0.8 K/uL (ref 0.1–1.0)
Monocytes Relative: 9.4 % (ref 3.0–12.0)
Neutro Abs: 4.5 K/uL (ref 1.4–7.7)
Neutrophils Relative %: 56.3 % (ref 43.0–77.0)
Platelets: 368 K/uL (ref 150.0–400.0)
RBC: 5.12 Mil/uL (ref 4.22–5.81)
RDW: 12.8 % (ref 11.5–15.5)
WBC: 8 K/uL (ref 4.0–10.5)

## 2024-03-24 LAB — PSA: PSA: 0.48 ng/mL (ref 0.10–4.00)

## 2024-03-24 LAB — HEPATIC FUNCTION PANEL
ALT: 44 U/L (ref 0–53)
AST: 20 U/L (ref 0–37)
Albumin: 4.8 g/dL (ref 3.5–5.2)
Alkaline Phosphatase: 68 U/L (ref 39–117)
Bilirubin, Direct: 0.1 mg/dL (ref 0.0–0.3)
Total Bilirubin: 0.8 mg/dL (ref 0.2–1.2)
Total Protein: 7.2 g/dL (ref 6.0–8.3)

## 2024-03-24 LAB — HEMOGLOBIN A1C: Hgb A1c MFr Bld: 6.2 % (ref 4.6–6.5)

## 2024-03-24 LAB — BASIC METABOLIC PANEL WITH GFR
BUN: 12 mg/dL (ref 6–23)
CO2: 26 meq/L (ref 19–32)
Calcium: 10.1 mg/dL (ref 8.4–10.5)
Chloride: 103 meq/L (ref 96–112)
Creatinine, Ser: 1 mg/dL (ref 0.40–1.50)
GFR: 91.98 mL/min (ref >60.00)
Glucose, Bld: 80 mg/dL (ref 70–99)
Potassium: 4.5 meq/L (ref 3.5–5.1)
Sodium: 140 meq/L (ref 135–145)

## 2024-03-24 LAB — BRAIN NATRIURETIC PEPTIDE: Pro B Natriuretic peptide (BNP): 60 pg/mL (ref 0.0–100.0)

## 2024-03-24 LAB — TSH: TSH: 2.97 u[IU]/mL (ref 0.35–5.50)

## 2024-03-24 MED ORDER — LEVOFLOXACIN 500 MG PO TABS: 500.0000 mg | ORAL_TABLET | Freq: Every day | ORAL | 0 refills | Status: DC

## 2024-03-24 NOTE — Progress Notes (Signed)
 Patient ID: Gabriel Burgess, male   DOB: 30-Oct-1980, 43 y.o.   MRN: [id number]        Chief Complaint: follow up URI and sob, preDM       HPI:  Gabriel Burgess is a 43 y.o. male  Here with 2-3 days acute onset fever, facial pain, pressure, headache, general weakness and malaise, and greenish d/c, with mild ST and cough with sob but no wheezing, but pt denies chest pain,  orthopnea, PND, increased LE swelling, palpitations, dizziness or syncope.   Pt denies polydipsia, polyuria, or new focal neuro s/s.    Pt denies recent wt loss, night sweats, loss of appetite, or other constitutional symptoms  Good compliance with xarelto   Had previous episode 2 mo ago tx for PNA at an UC with antibiotic, steroid and inhaler.  Has 4yo son in the home now ill with similar       Wt Readings from Last 3 Encounters:  03/24/24 212 lb (96.2 kg)  03/17/24 213 lb (96.6 kg)  02/24/24 212 lb (96.2 kg)   BP Readings from Last 3 Encounters:  03/24/24 120/82  03/17/24 122/68  02/24/24 128/72         Past Medical History:  Diagnosis Date   Allergy    DVT (deep venous thrombosis) (HCC)    right leg   Encounter for general adult medical examination with abnormal findings 01/02/2023   Witnessed episode of apnea 01/02/2023   Past Surgical History:  Procedure Laterality Date   WISDOM TOOTH EXTRACTION      reports that he has quit smoking. His smoking use included cigarettes. He has been exposed to tobacco smoke. He has never used smokeless tobacco. He reports current alcohol use of about 4.0 standard drinks of alcohol per week. He reports that he does not currently use drugs. family history includes AAA (abdominal aortic aneurysm) in his maternal grandfather; Anxiety disorder in his father; Cancer in his paternal grandmother; Cerebral aneurysm in his paternal grandfather; Hearing loss in his mother; Sleep apnea in his father; Stroke in his maternal grandmother. Allergies  Allergen Reactions   Amoxicillin    Current  Outpatient Medications on File Prior to Visit  Medication Sig Dispense Refill   atorvastatin  (LIPITOR) 20 MG tablet TAKE 1 TABLET BY MOUTH EVERY DAY. 90 tablet 0   XARELTO  10 MG TABS tablet TAKE 1 TABLET BY MOUTH EVERY DAY 90 tablet 3   predniSONE  (DELTASONE ) 20 MG tablet Take 2 tablets (40 mg total) by mouth daily with breakfast. (Patient not taking: Reported on 03/24/2024) 10 tablet 0   No current facility-administered medications on file prior to visit.        ROS:  All others reviewed and negative.  Objective        PE:  BP 120/82 (BP Location: Left Arm, Patient Position: Sitting, Cuff Size: Normal)   Pulse 73   Temp 98.5 F (36.9 C) (Oral)   Ht 6' (1.829 m)   Wt 212 lb (96.2 kg)   SpO2 99%   BMI 28.75 kg/m                 Constitutional: Pt appears mild ill               HENT: Head: NCAT.                Right Ear: External ear normal.                 Left Ear: External ear  normal. Bilat tm's with mild erythema.  Max sinus areas mild tender.  Pharynx with mild erythema, no exudate               Eyes: . Pupils are equal, round, and reactive to light. Conjunctivae and EOM are normal               Nose: without d/c or deformity               Neck: Neck supple. Gross normal ROM               Cardiovascular: Normal rate and regular rhythm.                 Pulmonary/Chest: Effort normal and breath sounds with few right mid lungfield rales but no  wheezing.                               Neurological: Pt is alert. At baseline orientation, motor grossly intact               Skin: Skin is warm. No rashes, no other new lesions, LE edema - none               Psychiatric: Pt behavior is normal without agitation   Micro: none  Cardiac tracings I have personally interpreted today:  none  Pertinent Radiological findings (summarize): none   Lab Results  Component Value Date   WBC 8.0 03/24/2024   HGB 15.5 03/24/2024   HCT 45.7 03/24/2024   PLT 368.0 03/24/2024   GLUCOSE 80  03/24/2024   CHOL 173 02/24/2024   TRIG 94.0 02/24/2024   HDL 47.60 02/24/2024   LDLCALC 106 (H) 02/24/2024   ALT 44 03/24/2024   AST 20 03/24/2024   NA 140 03/24/2024   K 4.5 03/24/2024   CL 103 03/24/2024   CREATININE 1.00 03/24/2024   BUN 12 03/24/2024   CO2 26 03/24/2024   TSH 2.97 03/24/2024   PSA 0.48 03/24/2024   HGBA1C 6.2 03/24/2024   Assessment/Plan:  Gabriel Burgess is a 42 y.o. Declined [7] male with  has a past medical history of Allergy, DVT (deep venous thrombosis) (HCC), Encounter for general adult medical examination with abnormal findings (01/02/2023), and Witnessed episode of apnea (01/02/2023).  Acute URI Mild to mod, can't r/o pna recurrent, for antibx course levaquin  500 mg every day course, declines cxr repeat,  to f/u any worsening symptoms or concerns  SOB (shortness of breath) Less likely PE but for d dimer with labs, and BNP  On anticoagulant therapy Pt encouraged to continue xarelto  10 mg qd  Prediabetes Lab Results  Component Value Date   HGBA1C 6.2 03/24/2024   Stable, pt to continue current medical treatment  - diet, wt control   Followup: Return if symptoms worsen or fail to improve.  Lynwood Rush, MD 03/26/2024 4:04 PM [facility]

## 2024-03-24 NOTE — Patient Instructions (Signed)
 Please take all new medication as prescribed - the antibiotic  Please continue all other medications as before, and refills have been done if requested.  Please have the pharmacy call with any other refills you may need.  Please continue your efforts at being more active, low cholesterol diet, and weight control.  Please keep your appointments with your specialists as you may have planned  We can hold on another chest xray for now, but if the D dimer is elevated, we would need a CTA chest to rule out blood clots  Other testing to consider would be an EKG, Echocardiogram or even pulmonary or cardiology referrals

## 2024-03-25 LAB — D-DIMER, QUANTITATIVE: D-Dimer, Quant: 0.2 ug{FEU}/mL (ref <0.50)

## 2024-03-26 ENCOUNTER — Encounter: Payer: Self-pay | Admitting: Internal Medicine

## 2024-03-26 DIAGNOSIS — R7303 Prediabetes: Secondary | ICD-10-CM | POA: Insufficient documentation

## 2024-03-26 DIAGNOSIS — J069 Acute upper respiratory infection, unspecified: Secondary | ICD-10-CM | POA: Insufficient documentation

## 2024-03-26 NOTE — Assessment & Plan Note (Signed)
 Lab Results  Component Value Date   HGBA1C 6.2 03/24/2024   Stable, pt to continue current medical treatment  - diet, wt control

## 2024-03-26 NOTE — Assessment & Plan Note (Signed)
 Pt encouraged to continue xarelto  10 mg qd

## 2024-03-26 NOTE — Assessment & Plan Note (Signed)
 Less likely PE but for d dimer with labs, and BNP

## 2024-03-26 NOTE — Assessment & Plan Note (Addendum)
 Mild to mod, can't r/o pna recurrent, for antibx course levaquin  500 mg every day course, declines cxr repeat,  to f/u any worsening symptoms or concerns

## 2024-03-30 ENCOUNTER — Encounter: Payer: Self-pay | Admitting: Hematology and Oncology

## 2024-03-30 MED ORDER — ALBUTEROL SULFATE HFA 108 (90 BASE) MCG/ACT IN AERS: 2.0000 | INHALATION_SPRAY | Freq: Four times a day (QID) | RESPIRATORY_TRACT | 1 refills | Status: DC | PRN

## 2024-03-31 ENCOUNTER — Other Ambulatory Visit: Payer: Self-pay | Admitting: *Deleted

## 2024-03-31 DIAGNOSIS — Z86718 Personal history of other venous thrombosis and embolism: Secondary | ICD-10-CM

## 2024-04-13 ENCOUNTER — Other Ambulatory Visit
Admission: RE | Admit: 2024-04-13 | Discharge: 2024-04-13 | Disposition: A | Discharge status: Home / Self Care | Source: Ambulatory Visit | Attending: Pulmonary Disease | Admitting: Pulmonary Disease

## 2024-04-13 ENCOUNTER — Encounter: Payer: Self-pay | Admitting: Pulmonary Disease

## 2024-04-13 ENCOUNTER — Ambulatory Visit (INDEPENDENT_AMBULATORY_CARE_PROVIDER_SITE_OTHER): Admitting: Pulmonary Disease

## 2024-04-13 VITALS — BP 136/70 | HR 72 | Temp 97.7°F | Ht 72.0 in | Wt 211.4 lb

## 2024-04-13 DIAGNOSIS — J45909 Unspecified asthma, uncomplicated: Secondary | ICD-10-CM

## 2024-04-13 DIAGNOSIS — D6851 Activated protein C resistance: Secondary | ICD-10-CM | POA: Diagnosis not present

## 2024-04-13 DIAGNOSIS — R0602 Shortness of breath: Secondary | ICD-10-CM

## 2024-04-13 LAB — CBC WITH DIFFERENTIAL/PLATELET
Abs Immature Granulocytes: 0.01 K/uL (ref 0.00–0.07)
Basophils Absolute: 0.1 K/uL (ref 0.0–0.1)
Basophils Relative: 1 %
Eosinophils Absolute: 0.2 K/uL (ref 0.0–0.5)
Eosinophils Relative: 3 %
HCT: 43.2 % (ref 39.0–52.0)
Hemoglobin: 15 g/dL (ref 13.0–17.0)
Immature Granulocytes: 0 %
Lymphocytes Relative: 35 %
Lymphs Abs: 2 K/uL (ref 0.7–4.0)
MCH: 30.4 pg (ref 26.0–34.0)
MCHC: 34.7 g/dL (ref 30.0–36.0)
MCV: 87.4 fL (ref 80.0–100.0)
Monocytes Absolute: 0.5 K/uL (ref 0.1–1.0)
Monocytes Relative: 10 %
Neutro Abs: 2.9 K/uL (ref 1.7–7.7)
Neutrophils Relative %: 51 %
Platelets: 321 K/uL (ref 150–400)
RBC: 4.94 MIL/uL (ref 4.22–5.81)
RDW: 12 % (ref 11.5–15.5)
WBC: 5.6 K/uL (ref 4.0–10.5)
nRBC: 0 % (ref 0.0–0.2)

## 2024-04-13 LAB — NITRIC OXIDE: Nitric Oxide: 40

## 2024-04-13 MED ORDER — BUDESONIDE-FORMOTEROL FUMARATE 160-4.5 MCG/ACT IN AERO: 2.0000 | INHALATION_SPRAY | Freq: Two times a day (BID) | RESPIRATORY_TRACT | 6 refills | Status: DC

## 2024-04-13 NOTE — Progress Notes (Signed)
 Subjective:    Patient ID: Gabriel Burgess, male    DOB: 22-Jan-1981, 43 y.o.   MRN: [id number]  Patient Care Team: Lendia Boby CROME, NP-C as PCP - General (Family Medicine)  Chief Complaint  Patient presents with   Consult    Unable to take full breaths. On and off through out the day. No cough or wheezing.     BACKGROUND: Patient is a 43 year old remote former smoker, with minimal tobacco use in the past, and a history as noted below who presents for evaluation of dyspnea.  He is kindly referred by Dr. Norleen Kidney.  HPI Discussed the use of AI scribe software for clinical note transcription with the patient, who gave verbal consent to proceed.  History of Present Illness   Gabriel Burgess is a 43 year old male who presents with shortness of breath. He was referred by his hematologist, Dr. Kidney, for evaluation of his respiratory symptoms.  He has been experiencing shortness of breath since the beginning of August, describing it as an inability to take a full breath. He has a history of pneumonia diagnosed in May. Since August, he has visited the doctor multiple times and has been treated with steroids and antibiotics. He completed a course of antibiotics last week and initially felt improvement, but his symptoms returned on Sunday and Monday.  He was prescribed an inhaler during his last visit, but he is uncertain about its effectiveness. It seems to provide some relief about 30 minutes to an hour after use, although he has never used an inhaler before and is unsure if the relief is significant. His shortness of breath can occur unexpectedly but is more noticeable in humid or hot and stuffy environments. He recalls an episode at the [facility] where he felt lightheaded.  He has a history of a blood clot in his leg a couple of years ago and is currently on Xarelto  given his history of factor V Leiden. He has had normal D-dimer tests recently. No history of asthma as a child and no known heart  issues or allergies. 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 He recalls that in May, his son had a bad cough, which he believes he contracted, leading to his pneumonia diagnosis at urgent care.     He smoked in the remote past but did not do so for a prolonged period of time.   Review of Systems A 10 point review of systems was performed and it is as noted above otherwise negative.   Past Medical History:  Diagnosis Date   Allergy    DVT (deep venous thrombosis) (HCC)    right leg   Encounter for general adult medical examination with abnormal findings 01/02/2023   Witnessed episode of apnea 01/02/2023    Past Surgical History:  Procedure Laterality Date   WISDOM TOOTH EXTRACTION      Patient Active Problem List   Diagnosis Date Noted   Acute URI 03/26/2024   Prediabetes 03/26/2024   SOB (shortness of breath) 03/24/2024   Esophageal dysphagia 02/24/2024   B12 deficiency 07/16/2023   Snores 01/02/2023   Hearing decreased, bilateral 01/02/2023   Witnessed episode of apnea 01/02/2023   Bilateral cold feet 01/02/2023   Thrombocytosis 01/02/2023   Encounter for general adult medical examination with abnormal findings 01/02/2023   Factor V Leiden (HCC) 12/12/2022   On anticoagulant therapy 12/12/2022   Chronic bilateral low back pain without sciatica 12/12/2022   Hyperlipidemia 11/01/2021   Acute deep  vein thrombosis (DVT) of right peroneal vein (HCC) 10/08/2021    Family History  Problem Relation Age of Onset   Hearing loss Mother    Anxiety disorder Father    Sleep apnea Father    Stroke Maternal Grandmother    AAA (abdominal aortic aneurysm) Maternal Grandfather    Cancer Paternal Grandmother    Cerebral aneurysm Paternal Grandfather     Social History   Tobacco Use   Smoking status: Former    Current packs/day: 0.50    Types: Cigarettes    Passive exposure: Past   Smokeless tobacco: Never  Substance Use Topics   Alcohol use: Yes     Alcohol/week: 4.0 standard drinks of alcohol    Types: 2 Glasses of wine, 2 Standard drinks or equivalent per week    Comment: occassionally    Allergies  Allergen Reactions   Amoxicillin     Current Meds  Medication Sig   albuterol  (VENTOLIN  HFA) 108 (90 Base) MCG/ACT inhaler Inhale 2 puffs into the lungs every 6 (six) hours as needed for wheezing or shortness of breath.   atorvastatin  (LIPITOR) 20 MG tablet TAKE 1 TABLET BY MOUTH EVERY DAY.   XARELTO  10 MG TABS tablet TAKE 1 TABLET BY MOUTH EVERY DAY    Immunization History  Administered Date(s) Administered   PFIZER(Purple Top)SARS-COV-2 Vaccination 10/28/2019, 11/22/2019, 07/18/2020   Pfizer Covid-19 Vaccine Bivalent Booster 52yrs & up 08/07/2021   Pfizer(Comirnaty)Fall Seasonal Vaccine 12 years and older 03/13/2024   Tdap 04/15/2020        Objective:     BP 136/70   Pulse 72   Temp 97.7 F (36.5 C) (Temporal)   Ht 6' (1.829 m)   Wt 211 lb 6.4 oz (95.9 kg)   SpO2 99%   BMI 28.67 kg/m   SpO2: 99 %  GENERAL: Well-developed, well-nourished gentleman, no acute distress.  Fully ambulatory, mild conversational dyspnea. HEAD: Normocephalic, atraumatic.  EYES: Pupils equal, round, reactive to light.  No scleral icterus.  MOUTH: Dentition intact, oral mucosa moist.  No thrush. NECK: Supple. No thyromegaly. Trachea midline. No JVD.  No adenopathy. PULMONARY: Good air entry bilaterally.  No adventitious sounds. CARDIOVASCULAR: S1 and S2. Regular rate and rhythm.  No rubs, murmurs or gallops heard. ABDOMEN: Benign. MUSCULOSKELETAL: No joint deformity, no clubbing, no edema.  NEUROLOGIC: No overt focal deficit, no gait disturbance, speech is fluent. SKIN: Intact,warm,dry. PSYCH: Mood and behavior normal.  Lab Results  Component Value Date   NITRICOXIDE 40 04/13/2024  *There is evidence of moderate type II inflammation present.       Assessment & Plan:     ICD-10-CM   1. Persistent asthma without complication,  unspecified asthma severity  J45.909 CBC with Differential/Platelet    Allergen Panel (27) + IGE    Pulmonary function test    2. Shortness of breath  R06.02 Nitric oxide     Pulmonary function test    3. Factor V Leiden [facility])  D68.51       Orders Placed This Encounter  Procedures   CBC with Differential/Platelet    Standing Status:   Future    Expected Date:   04/13/2024    Expiration Date:   04/13/2025   Allergen Panel (27) + IGE    Standing Status:   Future    Expiration Date:   04/13/2025   Nitric oxide    Pulmonary function test    Standing Status:   Future    Expiration Date:   04/13/2025  Scheduling Instructions:     Patient prefers PFTs at [facility] location.    Where should this test be performed?:   Outpatient Pulmonary    What type of PFT is being ordered?:   Full PFT    Meds ordered this encounter  Medications   budesonide -formoterol  (SYMBICORT ) 160-4.5 MCG/ACT inhaler    Sig: Inhale 2 puffs into the lungs 2 (two) times daily.    Dispense:  10.2 g    Refill:  6   Discussion:    Suspected asthma with airway inflammation and persistent shortness of breath Persistent shortness of breath since early August, possibly related to previous pneumonia in May. Elevated nitric oxide  level at 40, indicating airway inflammation. Differential includes post-viral asthmatic response. Inhaler provides some relief, suggesting airway reactivity. No history of childhood asthma. Symptoms exacerbated in humid or hot environments. Potential for improvement over time, acknowledging environmental allergens as a contributing factor. - Order allergen panel and IgE. - Prescribe Symbicort  inhaler, 160/4.5, two puffs daily. Instruct to rinse mouth after use to prevent thrush. - Continue albuterol  inhaler as needed for acute symptoms. - Schedule pulmonary function tests (PFTs) in [place]. - Arrange follow-up with a colleague at the [facility] in [place] (patient resides in  [place] and prefers that location).  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 Currently on Xarelto  for anticoagulation. Recent D-dimer tests normal, reducing concern for acute thrombotic events.       Advised if symptoms do not improve or worsen, to please contact office for sooner follow up or seek emergency care.    I spent 60 minutes of dedicated to the care of this patient on the date of this encounter to include pre-visit review of records, face-to-face time with the patient discussing conditions above, post visit ordering of testing, clinical documentation with the electronic health record, making appropriate referrals as documented, and communicating necessary findings to members of the patients care team.   C. Leita Sanders, MD Advanced Bronchoscopy PCCM [facility]    *This note was dictated using voice recognition software/Dragon.  Despite best efforts to proofread, errors can occur which can change the meaning. Any transcriptional errors that result from this process are unintentional and may not be fully corrected at the time of dictation.

## 2024-04-13 NOTE — Patient Instructions (Signed)
 VISIT SUMMARY:  Today, you were seen for your ongoing shortness of breath, which has been persistent since early August. We discussed your history of pneumonia in May and your current treatment with inhalers. We also reviewed your history of Factor V Leiden thrombophilia and your current medication regimen.  YOUR PLAN:  -SUSPECTED ASTHMA WITH AIRWAY INFLAMMATION AND PERSISTENT SHORTNESS OF BREATH: You have been experiencing shortness of breath, which may be related to airway inflammation possibly due to a post-viral asthmatic response. We will conduct a blood test for allergies and prescribe a Symbicort  inhaler, which you should use two puffs daily and rinse your mouth after each use to prevent thrush. Continue using your albuterol  inhaler as needed for acute symptoms. We will also schedule pulmonary function tests (PFTs) to further evaluate your lung function. A follow-up appointment will be arranged with a colleague at the [facility] in [place].  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 You have a history of a blood clot in your leg and are currently taking Xarelto  to prevent further clots. Your recent D-dimer tests were normal, which is a good sign that you do not have any new blood clots.  INSTRUCTIONS:  Please follow up with the blood test for allergies and use the Symbicort  inhaler as prescribed. Continue using the albuterol  inhaler as needed. We will schedule your pulmonary function tests (PFTs) in [place] and arrange a follow-up appointment with a colleague at the [facility] in [place].

## 2024-04-14 ENCOUNTER — Encounter: Payer: Self-pay | Admitting: Gastroenterology

## 2024-04-15 LAB — ALLERGEN PANEL (27) + IGE
Alternaria Alternata IgE: <0.1 kU/L
Aspergillus Fumigatus IgE: <0.1 kU/L
Bahia Grass IgE: <0.1 kU/L
Bermuda Grass IgE: <0.1 kU/L
Cat Dander IgE: <0.1 kU/L
Cedar, Mountain IgE: <0.1 kU/L
Cladosporium Herbarum IgE: <0.1 kU/L
Cocklebur IgE: <0.1 kU/L
Cockroach, American IgE: <0.1 kU/L
Common Silver Birch IgE: <0.1 kU/L
D Farinae IgE: <0.1 kU/L
D Pteronyssinus IgE: <0.1 kU/L
Dog Dander IgE: <0.1 kU/L
Elm, American IgE: <0.1 kU/L
Hickory, White IgE: <0.1 kU/L
IgE (Immunoglobulin E), Serum: 8 [IU]/mL (ref 6–495)
Johnson Grass IgE: <0.1 kU/L
Kentucky Bluegrass IgE: <0.1 kU/L
Maple/Box Elder IgE: 0.2 kU/L — AB
Mucor Racemosus IgE: <0.1 kU/L
Oak, White IgE: 0.11 kU/L — AB
Penicillium Chrysogen IgE: <0.1 kU/L
Pigweed, Rough IgE: <0.1 kU/L
Plantain, English IgE: <0.1 kU/L
Ragweed, Short IgE: <0.1 kU/L
Setomelanomma Rostrat: <0.1 kU/L
Timothy Grass IgE: <0.1 kU/L
White Mulberry IgE: <0.1 kU/L

## 2024-04-17 NOTE — Progress Notes (Unsigned)
 [facility] Telephone:([phone]   [phone]) [phone]  PROGRESS NOTE  Patient Care Team: Lendia Boby CROME, NP-C as PCP - General (Family Medicine)  Hematological/Oncological History # Family History of Factor V Leiden # Unprovoked Right Lower Extremity DVT  10/06/2021: Patient underwent a right lower extremity ultrasound which showed an acute deep vein thrombosis involving the right  popliteal vein, right posterior tibial veins, and right peroneal veins.  Same-day CT PE study showed no evidence of pulmonary embolism. 12/22/2021: Establish care with Dr. Federico  Interval History:  Gabriel Burgess 43 y.o. 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 The patient's last visit was on 04/24/2023.  In the interim since the last visit he has continued on Xarelto  therapy as prescribed  On exam today Gabriel Burgess reports he has had no major changes in his health in the interim since our last visit. He is tolerating Xarelto  therapy without any signs of easy bruising or overt bleeding. He denies any signs or symptoms of a DVT/PE including no edema, shortness of breath or chest. His energy and appetite are unchanged. Overall he feels well and has no questions or concerns regarding anticoagulation therapy.  He denies any fevers, chills, sweats, nausea vomiting or diarrhea. A full 10 point ROS was otherwise negative.   MEDICAL HISTORY:  Past Medical History:  Diagnosis Date   Allergy    DVT (deep venous thrombosis) (HCC)    right leg   Encounter for general adult medical examination with abnormal findings 01/02/2023   Witnessed episode of apnea 01/02/2023    SURGICAL HISTORY: Past Surgical History:  Procedure Laterality Date   WISDOM TOOTH EXTRACTION      SOCIAL HISTORY: Social History   Socioeconomic History   Marital status: Married    Spouse name: Not on file   Number  of children: Not on file   Years of education: Not on file   Highest education level: Bachelor's degree (e.g., BA, AB, BS)  Occupational History   Not on file  Tobacco Use   Smoking status: Former    Current packs/day: 0.50    Types: Cigarettes    Passive exposure: Past   Smokeless tobacco: Never  Vaping Use   Vaping status: Never Used  Substance and Sexual Activity   Alcohol use: Yes    Alcohol/week: 4.0 standard drinks of alcohol    Types: 2 Glasses of wine, 2 Standard drinks or equivalent per week    Comment: occassionally   Drug use: Not Currently   Sexual activity: Yes    Partners: Female    Birth control/protection: None  Other Topics Concern   Not on file  Social History Narrative   Not on file   Social Drivers of Health   Financial Resource Strain: Low Risk  (02/22/2024)   Overall Financial Resource Strain (CARDIA)    Difficulty of Paying Living Expenses: Not very hard  Food Insecurity: No Food Insecurity (02/22/2024)   Hunger Vital Sign    Worried About Running Out of Food in the Last Year: Never true    Ran Out of Food in the Last Year: Never true  Transportation Needs: No Transportation Needs (02/22/2024)   PRAPARE - Administrator, Civil Service (Medical): No    Lack of Transportation (Non-Medical): No  Physical Activity: Insufficiently Active (02/22/2024)   Exercise Vital Sign    Days of Exercise per Week:  4 days    Minutes of Exercise per Session: 30 min  Stress: Stress Concern Present (02/22/2024)   [employer] of Occupational Health - Occupational Stress Questionnaire    Feeling of Stress: To some extent  Social Connections: Moderately Isolated (02/22/2024)   Social Connection and Isolation Panel    Frequency of Communication with Friends and Family: More than three times a week    Frequency of Social Gatherings with Friends and Family: Once a week    Attends Religious Services: Patient declined    Database administrator or Organizations:  No    Attends Engineer, structural: Not on file    Marital Status: Married  Intimate Partner Violence: Unknown (09/26/2022)   Received from [facility]   HITS    Physically Hurt: Not on file    Insult or Talk Down To: Not on file    Threaten Physical Harm: Not on file    Scream or Curse: Not on file    FAMILY HISTORY: Family History  Problem Relation Age of Onset   Hearing loss Mother    Anxiety disorder Father    Sleep apnea Father    Stroke Maternal Grandmother    AAA (abdominal aortic aneurysm) Maternal Grandfather    Cancer Paternal Grandmother    Cerebral aneurysm Paternal Grandfather     ALLERGIES:  is allergic to amoxicillin.  MEDICATIONS:  Current Outpatient Medications  Medication Sig Dispense Refill   albuterol  (VENTOLIN  HFA) 108 (90 Base) MCG/ACT inhaler Inhale 2 puffs into the lungs every 6 (six) hours as needed for wheezing or shortness of breath. 8 g 1   atorvastatin  (LIPITOR) 20 MG tablet TAKE 1 TABLET BY MOUTH EVERY DAY. 90 tablet 0   budesonide -formoterol  (SYMBICORT ) 160-4.5 MCG/ACT inhaler Inhale 2 puffs into the lungs 2 (two) times daily. 10.2 g 6   XARELTO  10 MG TABS tablet TAKE 1 TABLET BY MOUTH EVERY DAY 90 tablet 3   No current facility-administered medications for this visit.    REVIEW OF SYSTEMS:   Constitutional: ( - ) fevers, ( - )  chills , ( - ) night sweats Eyes: ( - ) blurriness of vision, ( - ) double vision, ( - ) watery eyes Ears, nose, mouth, throat, and face: ( - ) mucositis, ( - ) sore throat Respiratory: ( - ) cough, ( - ) dyspnea, ( - ) wheezes Cardiovascular: ( - ) palpitation, ( - ) chest discomfort, ( - ) lower extremity swelling Gastrointestinal:  ( - ) nausea, ( - ) heartburn, ( - ) change in bowel habits Skin: ( - ) abnormal skin rashes Lymphatics: ( - ) new lymphadenopathy, ( - ) easy bruising Neurological: ( - ) numbness, ( - ) tingling, ( - ) new weaknesses Behavioral/Psych: ( - ) mood change, ( - ) new changes   All other systems were reviewed with the patient and are negative.  PHYSICAL EXAMINATION:  There were no vitals filed for this visit.   There were no vitals filed for this visit.    GENERAL: Well-appearing middle-age Caucasian male, alert, no distress and comfortable SKIN: skin color, texture, turgor are normal, no rashes or significant lesions EYES: conjunctiva are pink and non-injected, sclera clear LUNGS: clear to auscultation and percussion with normal breathing effort HEART: regular rate & rhythm and no murmurs and no lower extremity edema Musculoskeletal: no cyanosis of digits and no clubbing  PSYCH: alert & oriented x 3, fluent speech NEURO: no focal motor/sensory deficits  LABORATORY DATA:  I have reviewed the data as listed    Latest Ref Rng & Units 04/13/2024   10:40 AM 03/24/2024   10:25 AM 03/17/2024    4:26 PM  CBC  WBC 4.0 - 10.5 K/uL 5.6  8.0  8.6   Hemoglobin 13.0 - 17.0 g/dL 84.9  84.4  84.9   Hematocrit 39.0 - 52.0 % 43.2  45.7  44.3   Platelets 150 - 400 K/uL 321  368.0  348.0        Latest Ref Rng & Units 03/24/2024   10:25 AM 02/24/2024   10:09 AM 01/06/2024    3:40 PM  CMP  Glucose 70 - 99 mg/dL 80  891  862   BUN 6 - 23 mg/dL 12  15  17    Creatinine 0.40 - 1.50 mg/dL 8.99  9.02  9.07   Sodium 135 - 145 mEq/L 140  139  139   Potassium 3.5 - 5.1 mEq/L 4.5  4.2  4.3   Chloride 96 - 112 mEq/L 103  104  104   CO2 19 - 32 mEq/L 26  27  27    Calcium  8.4 - 10.5 mg/dL 89.8  9.9  89.8   Total Protein 6.0 - 8.3 g/dL 7.2  7.7  7.9   Total Bilirubin 0.2 - 1.2 mg/dL 0.8  0.9  0.4   Alkaline Phos 39 - 117 U/L 68  70  80   AST 0 - 37 U/L 20  20  21    ALT 0 - 53 U/L 44  30  45     RADIOGRAPHIC STUDIES: No results found.  ASSESSMENT & PLAN Gabriel Burgess is a 43 y.o. 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     # Unprovoked DVT/Pulmonary Embolism   --findings at this time are consistent with a unprovoked VTE  -- At each visit will order CMP and CBC to assure labs are adequate for DOAC therapy  --ruled out APS with anticardiolipin and anti beta2 glycoprotein antibodies.  Lupus anticoagulant panel would be altered by presence of blood thinner, will hold on this testing.   --patient denies any bleeding, bruising, or dark stools on this medication. It is well tolerated. No difficulties accessing/affording the medication  -- labs today show WBC 6.2, hemoglobin 15.3, MCV 87.9, and platelets of 340.Creatinine and LFTs in range.  --recommend the patient continue xarelto , currently on maintenance dose 10 mg PO daily.   --RTC in 6 months' time with strict return precautions for overt signs of bleeding.     # Heterozygous for Factor V Leiden #  Family History of Factor V Leiden -- Patient found to be heterozygous for factor V Leiden. -- Patient has a young son.  At this time there is no indication for genetic testing of his child.  Patient notes that his wife is of [place] descent (low risk of being a FVL carrier).  No orders of the defined types were placed in this encounter.   All questions were answered. The patient knows to call the clinic with any problems, questions or concerns.  A total of more than 25 minutes were spent on this encounter with face-to-face time and non-face-to-face time, including preparing to see the patient, ordering tests and/or medications, counseling the patient and coordination of care as outlined above.   Norleen IVAR Kidney, MD Department of Hematology/Oncology [facility] Phone: [phone] Pager: [phone] Email: [email or web address]     04/17/2024 9:44 PM

## 2024-04-18 ENCOUNTER — Inpatient Hospital Stay (HOSPITAL_BASED_OUTPATIENT_CLINIC_OR_DEPARTMENT_OTHER): Payer: BC Managed Care – PPO | Admitting: Hematology and Oncology

## 2024-04-18 ENCOUNTER — Inpatient Hospital Stay: Payer: BC Managed Care – PPO | Attending: Hematology and Oncology

## 2024-04-18 VITALS — BP 123/76 | HR 71 | Temp 98.0°F | Resp 17 | Ht 72.0 in | Wt 209.0 lb

## 2024-04-18 DIAGNOSIS — I82461 Acute embolism and thrombosis of right calf muscular vein: Secondary | ICD-10-CM

## 2024-04-18 DIAGNOSIS — D6851 Activated protein C resistance: Secondary | ICD-10-CM | POA: Diagnosis not present

## 2024-04-18 DIAGNOSIS — Z7901 Long term (current) use of anticoagulants: Secondary | ICD-10-CM | POA: Diagnosis not present

## 2024-04-18 DIAGNOSIS — Z87891 Personal history of nicotine dependence: Secondary | ICD-10-CM | POA: Insufficient documentation

## 2024-04-18 DIAGNOSIS — Z86718 Personal history of other venous thrombosis and embolism: Secondary | ICD-10-CM | POA: Diagnosis not present

## 2024-04-18 DIAGNOSIS — Z809 Family history of malignant neoplasm, unspecified: Secondary | ICD-10-CM | POA: Diagnosis not present

## 2024-04-18 DIAGNOSIS — J45909 Unspecified asthma, uncomplicated: Secondary | ICD-10-CM | POA: Insufficient documentation

## 2024-04-18 LAB — CMP (CANCER CENTER ONLY)
ALT: 34 U/L (ref 0–44)
AST: 22 U/L (ref 15–41)
Albumin: 5 g/dL (ref 3.5–5.0)
Alkaline Phosphatase: 69 U/L (ref 38–126)
Anion gap: 5 (ref 5–15)
BUN: 12 mg/dL (ref 6–20)
CO2: 29 mmol/L (ref 22–32)
Calcium: 10.1 mg/dL (ref 8.9–10.3)
Chloride: 107 mmol/L (ref 98–111)
Creatinine: 0.97 mg/dL (ref 0.61–1.24)
GFR, Estimated: >60 mL/min (ref >60)
Glucose, Bld: 107 mg/dL — ABNORMAL HIGH (ref 70–99)
Potassium: 4.5 mmol/L (ref 3.5–5.1)
Sodium: 141 mmol/L (ref 135–145)
Total Bilirubin: 0.8 mg/dL (ref 0.0–1.2)
Total Protein: 7.8 g/dL (ref 6.5–8.1)

## 2024-04-18 LAB — CBC WITH DIFFERENTIAL (CANCER CENTER ONLY)
Abs Immature Granulocytes: 0.02 K/uL (ref 0.00–0.07)
Basophils Absolute: 0 K/uL (ref 0.0–0.1)
Basophils Relative: 1 %
Eosinophils Absolute: 0.1 K/uL (ref 0.0–0.5)
Eosinophils Relative: 1 %
HCT: 43.4 % (ref 39.0–52.0)
Hemoglobin: 15 g/dL (ref 13.0–17.0)
Immature Granulocytes: 0 %
Lymphocytes Relative: 24 %
Lymphs Abs: 1.6 K/uL (ref 0.7–4.0)
MCH: 30.4 pg (ref 26.0–34.0)
MCHC: 34.6 g/dL (ref 30.0–36.0)
MCV: 88 fL (ref 80.0–100.0)
Monocytes Absolute: 0.4 K/uL (ref 0.1–1.0)
Monocytes Relative: 7 %
Neutro Abs: 4.2 K/uL (ref 1.7–7.7)
Neutrophils Relative %: 67 %
Platelet Count: 336 K/uL (ref 150–400)
RBC: 4.93 MIL/uL (ref 4.22–5.81)
RDW: 12 % (ref 11.5–15.5)
WBC Count: 6.4 K/uL (ref 4.0–10.5)
nRBC: 0 % (ref 0.0–0.2)

## 2024-04-18 MED ORDER — RIVAROXABAN 10 MG PO TABS: 10.0000 mg | ORAL_TABLET | Freq: Every day | ORAL | 3 refills | Status: AC

## 2024-04-19 ENCOUNTER — Ambulatory Visit (INDEPENDENT_AMBULATORY_CARE_PROVIDER_SITE_OTHER): Admitting: Internal Medicine

## 2024-04-19 DIAGNOSIS — J45909 Unspecified asthma, uncomplicated: Secondary | ICD-10-CM

## 2024-04-19 DIAGNOSIS — R0602 Shortness of breath: Secondary | ICD-10-CM | POA: Diagnosis not present

## 2024-04-19 LAB — PULMONARY FUNCTION TEST
DL/VA % pred: 108 %
DL/VA: 4.9 ml/min/mmHg/L
DLCO cor % pred: 114 %
DLCO cor: 39.39 ml/min/mmHg
DLCO unc % pred: 115 %
DLCO unc: 39.83 ml/min/mmHg
FEF 25-75 Post: 4.17 L/s
FEF 25-75 Pre: 3.81 L/s
FEF2575-%Change-Post: 9 %
FEF2575-%Pred-Post: 99 %
FEF2575-%Pred-Pre: 90 %
FEV1-%Change-Post: 1 %
FEV1-%Pred-Post: 100 %
FEV1-%Pred-Pre: 98 %
FEV1-Post: 4.66 L
FEV1-Pre: 4.57 L
FEV1FVC-%Change-Post: 2 %
FEV1FVC-%Pred-Pre: 94 %
FEV6-%Change-Post: 0 %
FEV6-%Pred-Post: 105 %
FEV6-%Pred-Pre: 105 %
FEV6-Post: 6.05 L
FEV6-Pre: 6.05 L
FEV6FVC-%Change-Post: 0 %
FEV6FVC-%Pred-Post: 102 %
FEV6FVC-%Pred-Pre: 102 %
FVC-%Change-Post: 0 %
FVC-%Pred-Post: 102 %
FVC-%Pred-Pre: 102 %
FVC-Post: 6.08 L
FVC-Pre: 6.09 L
Post FEV1/FVC ratio: 77 %
Post FEV6/FVC ratio: 100 %
Pre FEV1/FVC ratio: 75 %
Pre FEV6/FVC Ratio: 99 %
RV % pred: 119 %
RV: 2.53 L
TLC % pred: 111 %
TLC: 8.65 L

## 2024-04-19 NOTE — Patient Instructions (Signed)
 Full pft performed today

## 2024-04-19 NOTE — Progress Notes (Signed)
 Full pft performed today

## 2024-04-20 ENCOUNTER — Ambulatory Visit: Payer: Self-pay | Admitting: Pulmonary Disease

## 2024-05-25 ENCOUNTER — Encounter: Payer: Self-pay | Admitting: Primary Care

## 2024-05-25 ENCOUNTER — Telehealth: Payer: Self-pay | Admitting: Primary Care

## 2024-05-25 ENCOUNTER — Ambulatory Visit: Admitting: Primary Care

## 2024-05-25 VITALS — BP 146/82 | HR 65 | Temp 98.6°F | Ht 74.0 in | Wt 208.6 lb

## 2024-05-25 DIAGNOSIS — J45909 Unspecified asthma, uncomplicated: Secondary | ICD-10-CM

## 2024-05-25 DIAGNOSIS — R0602 Shortness of breath: Secondary | ICD-10-CM

## 2024-05-25 LAB — NITRIC OXIDE: Nitric Oxide: 22

## 2024-05-25 LAB — POCT EXHALED NITRIC OXIDE: FeNO level (ppb): 22

## 2024-05-25 MED ORDER — ALBUTEROL SULFATE HFA 108 (90 BASE) MCG/ACT IN AERS: 2.0000 | INHALATION_SPRAY | Freq: Four times a day (QID) | RESPIRATORY_TRACT | 2 refills | Status: AC | PRN

## 2024-05-25 MED ORDER — MONTELUKAST SODIUM 10 MG PO TABS: 10.0000 mg | ORAL_TABLET | Freq: Every day | ORAL | 2 refills | Status: DC

## 2024-05-25 NOTE — Telephone Encounter (Signed)
 Yes, of course. Sorry about that I've have gone ahead and sent in prescription

## 2024-05-25 NOTE — Telephone Encounter (Signed)
 I did speak with him about that and agree. Thanks [employer]

## 2024-05-25 NOTE — Patient Instructions (Addendum)
  VISIT SUMMARY: You came in today for an evaluation of your shortness of breath, which has been ongoing since August. We discussed your asthma management, your history of pulmonary embolism, and the possibility of sleep apnea.  YOUR PLAN: -ASTHMA WITH PERSISTENT SHORTNESS OF BREATH: Asthma is a condition where your airways become inflamed and narrow, making it hard to breathe. Your symptoms include shortness of breath without cough, chest tightness, or wheezing. We will continue your current medication, Symbicort , and add Singulair to help manage both asthma and allergies. We also discussed the potential side effects of Singulair, such as vivid dreams and worsening depression or suicidal thoughts. Additionally, we will order an echocardiogram to rule out any heart-related causes and repeat the FENO test to check for airway inflammation. A consultation with Dr. Tamea regarding a CT scan will also be arranged.  -HISTORY OF PULMONARY EMBOLISM ON ANTICOAGULATION FOR FACTOR V LEIDEN THROMBOPHILIA: A pulmonary embolism is a blood clot in the lungs, and you are on Xarelto  to prevent further clots due to a condition called Factor V Leiden thrombophilia. Continue taking your medication as prescribed.  -POSSIBLE SLEEP APNEA: Sleep apnea is a condition where your breathing stops and starts during sleep. You have symptoms that suggest this condition, such as waking up when lying on your back and feeling more rested when propped up. We will order an overnight oximetry test to check your oxygen levels while you sleep, if abnormal we will proceed with a formal sleep study.   INSTRUCTIONS: Please continue taking Symbicort  and start taking Singulair as discussed. We will schedule an echocardiogram and repeat the FENO test. You will also have an overnight oximetry test to evaluate for sleep apnea. A consultation with Dr. Tamea regarding a CT scan will be arranged. Follow up with your GI doctor in November as  planned.  Orders: FENO re: asthma Echocardiogram re: dyspnea   Follow-up: 3 months with Dr. Tamea in [place] or sooner as needed

## 2024-05-25 NOTE — Telephone Encounter (Signed)
 Are you okay with refilling albuterol ,not prescribes by you?

## 2024-05-25 NOTE — Telephone Encounter (Signed)
 Can you see muscle cramping with Symbicort ? I couldn't find anything to suggest this would be causes by ICS/LABA inhaler

## 2024-05-25 NOTE — Progress Notes (Addendum)
 @Patient  ID: Gabriel Burgess, male    DOB: 05-04-1981, 43 y.o.   MRN: [id number]  Chief Complaint  Patient presents with   Asthma    Referring provider: Lendia Boby CROME, NP-C  HPI: 43 year old remote former smoker, with minimal tobacco use in the past, and a history as noted below who presents for evaluation of dyspnea.  He is kindly referred by Dr. Norleen Kidney.  Previous [facility] pulmonary encounter: 04/13/24- Consult, Dr. Tamea  Discussed the use of AI scribe software for clinical note transcription with the patient, who gave verbal consent to proceed.  History of Present Illness   Gabriel Burgess is a 43 year old male who presents with shortness of breath. He was referred by his hematologist, Dr. Kidney, for evaluation of his respiratory symptoms.  He has been experiencing shortness of breath since the beginning of August, describing it as an inability to take a full breath. He has a history of pneumonia diagnosed in May. Since August, he has visited the doctor multiple times and has been treated with steroids and antibiotics. He completed a course of antibiotics last week and initially felt improvement, but his symptoms returned on Sunday and Monday.  He was prescribed an inhaler during his last visit, but he is uncertain about its effectiveness. It seems to provide some relief about 30 minutes to an hour after use, although he has never used an inhaler before and is unsure if the relief is significant. His shortness of breath can occur unexpectedly but is more noticeable in humid or hot and stuffy environments. He recalls an episode at the [facility] where he felt lightheaded.  He has a history of a blood clot in his leg a couple of years ago and is currently on Xarelto  given his history of factor V Leiden. He has had normal D-dimer tests recently. No history of asthma as a child and no known heart issues or allergies. 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 He recalls that in May,  his son had a bad cough, which he believes he contracted, leading to his pneumonia diagnosis at urgent care.     He smoked in the remote past but did not do so for a prolonged period of time.   05/25/2024- Interim hx  Discussed the use of AI scribe software for clinical note transcription with the patient, who gave verbal consent to proceed.  History of Present Illness Gabriel Burgess is a 43 year old male with asthma who presents with shortness of breath. He was referred by Norleen Kidney for evaluation of shortness of breath.  Shortness of breath began in August, characterized by difficulty taking a full breath. Treatment included steroids, antibiotics, and an inhaler, which provides relief for about thirty minutes. Symptoms worsen in hot, humid, and stuffy environments.  He uses Symbicort  160 and albuterol  as needed. Symbicort  initially helped, but he still experiences days with significant symptoms. Shortness of breath has improved recently over the last couple of days, possibly due to weather changes. No cough, chest tightness, or wheezing, with shortness of breath as the main complaint.  He has a history of a blood clot in his legs and is on Xarelto  for Factor V Leiden. He is a former smoker with minimal past tobacco use. D-dimer testing has been negative.   A previous nitric oxide  test was elevated at 40. Allergy testing showed mild allergies to maple, box elder, and oak. Eosinophils were slightly elevated at 200. A breathing test was normal with  no obstructive lung disease and normal diffusion capacity. A chest x-ray in August showed no active cardiopulmonary disease.  He mentions problems with food getting stuck and has an upcoming appointment with a GI doctor in November. He denies significant weight loss, though he has lost a couple of pounds recently.  Lab Results  Component Value Date   NITRICOXIDE 40 04/13/2024    FENO 05/25/2024 >> 22   This result suggests low (<25) Type 2  (T2) airway inflammation indicating a low likelihood of active T2-driven airway inflammation; reduced probability of response to inhaled corticosteroids.    Allergies  Allergen Reactions   Amoxicillin     Immunization History  Administered Date(s) Administered   PFIZER(Purple Top)SARS-COV-2 Vaccination 10/28/2019, 11/22/2019, 07/18/2020   Pfizer Covid-19 Vaccine Bivalent Booster 30yrs & up 08/07/2021   Pfizer(Comirnaty)Fall Seasonal Vaccine 12 years and older 03/13/2024   Tdap 04/15/2020    Past Medical History:  Diagnosis Date   Allergy    DVT (deep venous thrombosis) (HCC)    right leg   Encounter for general adult medical examination with abnormal findings 01/02/2023   Witnessed episode of apnea 01/02/2023    Tobacco History: Social History   Tobacco Use  Smoking Status Former   Current packs/day: 0.50   Types: Cigarettes   Passive exposure: Past  Smokeless Tobacco Never   Counseling given: Not Answered   Outpatient Medications Prior to Visit  Medication Sig Dispense Refill   albuterol  (VENTOLIN  HFA) 108 (90 Base) MCG/ACT inhaler Inhale 2 puffs into the lungs every 6 (six) hours as needed for wheezing or shortness of breath. 8 g 1   atorvastatin  (LIPITOR) 20 MG tablet TAKE 1 TABLET BY MOUTH EVERY DAY. 90 tablet 0   budesonide -formoterol  (SYMBICORT ) 160-4.5 MCG/ACT inhaler Inhale 2 puffs into the lungs 2 (two) times daily. 10.2 g 6   rivaroxaban  (XARELTO ) 10 MG TABS tablet Take 1 tablet (10 mg total) by mouth daily. 90 tablet 3   No facility-administered medications prior to visit.   Review of Systems  Review of Systems  Constitutional: Negative.   HENT: Negative.    Respiratory:  Positive for shortness of breath. Negative for cough, chest tightness and wheezing.   Cardiovascular: Negative.    Physical Exam  BP (!) 146/82   Pulse 65   Temp 98.6 F (37 C)   Ht 6' 2 (1.88 m)   Wt 208 lb 9.6 oz (94.6 kg)   SpO2 100% Comment: RA  BMI 26.78 kg/m  Physical  Exam Constitutional:      General: He is not in acute distress.    Appearance: Normal appearance. He is not ill-appearing.  HENT:     Head: Normocephalic and atraumatic.     Mouth/Throat:     Mouth: Mucous membranes are moist.     Pharynx: Oropharynx is clear.  Cardiovascular:     Rate and Rhythm: Normal rate and regular rhythm.  Pulmonary:     Effort: Pulmonary effort is normal.     Breath sounds: Normal breath sounds. No wheezing, rhonchi or rales.     Comments: CTA Musculoskeletal:        General: Normal range of motion.  Skin:    General: Skin is warm and dry.  Neurological:     General: No focal deficit present.     Mental Status: He is alert and oriented to person, place, and time. Mental status is at baseline.  Psychiatric:        Mood and Affect: Mood normal.  Behavior: Behavior normal.        Thought Content: Thought content normal.        Judgment: Judgment normal.     Lab Results:  CBC    Component Value Date/Time   WBC 6.4 04/18/2024 1022   WBC 5.6 04/13/2024 1040   RBC 4.93 04/18/2024 1022   HGB 15.0 04/18/2024 1022   HCT 43.4 04/18/2024 1022   PLT 336 04/18/2024 1022   MCV 88.0 04/18/2024 1022   MCH 30.4 04/18/2024 1022   MCHC 34.6 04/18/2024 1022   RDW 12.0 04/18/2024 1022   LYMPHSABS 1.6 04/18/2024 1022   MONOABS 0.4 04/18/2024 1022   EOSABS 0.1 04/18/2024 1022   BASOSABS 0.0 04/18/2024 1022    BMET    Component Value Date/Time   NA 141 04/18/2024 1022   K 4.5 04/18/2024 1022   CL 107 04/18/2024 1022   CO2 29 04/18/2024 1022   GLUCOSE 107 (H) 04/18/2024 1022   BUN 12 04/18/2024 1022   CREATININE 0.97 04/18/2024 1022   CALCIUM  10.1 04/18/2024 1022   GFRNONAA >60 04/18/2024 1022    BNP No results found for: BNP  ProBNP    Component Value Date/Time   PROBNP 60.0 03/24/2024 1025    Imaging: No results found.   Assessment & Plan:   1. Shortness of breath (Primary) - Pulse oximetry, overnight; Future - ECHOCARDIOGRAM  COMPLETE; Future  2. Persistent asthma without complication, unspecified asthma severity - POCT EXHALED NITRIC OXIDE   Assessment and Plan Assessment & Plan Asthma with persistent shortness of breath Asthma diagnosed a month ago with persistent shortness of breath since August. Symptoms include shortness of breath without cough, chest tightness, or wheezing. Inhaler provides some relief, but symptoms persist. Normal breathing test, but elevated eosinophils and nitric oxide  levels suggest airway inflammation. Differential diagnosis includes cardiac causes; echocardiogram considered. Environmental allergens and previous pneumonia may contribute. Symbicort  provides some relief, but symptoms fluctuate. Albuterol  provides limited relief. Considering adding Singulair for potential asthma and allergy management. Discussed potential side effects of Singulair, including vivid dreams and exacerbation of depression or suicidal thoughts. - Continue Symbicort  160mcg two puffs morning and evening  - Add Singulair 10mg  at bedtime  - Order echocardiogram to evaluate persistent dyspnea symptoms  - Repeat FENO test was improved at 22 (40) while on Symbicort   - Consult with Dr. Tamea regarding possible CT chest imaging, holding off for now  History of pulmonary embolism  - Pulmonary embolism with ongoing anticoagulation therapy using Xarelto  due to Factor V Leiden thrombophilia.  Possible sleep apnea Possible sleep apnea with previous inconclusive home sleep test. Symptoms include waking up sob when lying on back. Considering overnight oximetry test to evaluate for oxygen desaturation at night, which could indicate sleep apnea. If ONO abnormal, consider formal sleep study  - Order overnight oximetry test   Almarie LELON Ferrari, NP 05/25/2024

## 2024-05-25 NOTE — Addendum Note (Signed)
 Addended by: Callahan Peddie T on: 05/25/2024 01:15 PM   Modules accepted: Orders

## 2024-06-01 ENCOUNTER — Ambulatory Visit: Payer: Self-pay | Admitting: Pulmonary Disease

## 2024-06-01 ENCOUNTER — Ambulatory Visit: Admitting: Student in an Organized Health Care Education/Training Program

## 2024-06-01 ENCOUNTER — Encounter: Payer: Self-pay | Admitting: Student in an Organized Health Care Education/Training Program

## 2024-06-01 VITALS — BP 116/76 | HR 95 | Temp 97.7°F | Ht 74.0 in | Wt 207.8 lb

## 2024-06-01 DIAGNOSIS — D6859 Other primary thrombophilia: Secondary | ICD-10-CM

## 2024-06-01 DIAGNOSIS — R0602 Shortness of breath: Secondary | ICD-10-CM

## 2024-06-01 DIAGNOSIS — J45909 Unspecified asthma, uncomplicated: Secondary | ICD-10-CM | POA: Diagnosis not present

## 2024-06-01 DIAGNOSIS — Z7901 Long term (current) use of anticoagulants: Secondary | ICD-10-CM | POA: Diagnosis not present

## 2024-06-01 NOTE — Progress Notes (Signed)
 Assessment & Plan:   #Shortness of breath #Mild Intermittent Asthma  Persistent shortness of breath since August 2025, initially suspected to be asthma, shows normal PFTs and no significant improvement with Symbicort  and montelukast. Symptoms fluctuate with environmental changes and improve in cooler air. No wheezing or cough present. Differential includes asthma, environmental triggers, and deconditioning. Continue Symbicort  twice daily, discontinue Singulair, and order a chest CT. Proceed with the scheduled echocardiogram. Advise a graded exercise program to improve conditioning and avoid exposure to acrylic paints and fumes for a few weeks.  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 Hematologist does not suspect PE due to anticoagulation and negative D-dimer tests. Will Obtain chest CT. Could consider perfusion scan depending on findings from echocardiogram though doubt he has CTEPH.   I spent 40 minutes caring for this patient today, including preparing to see the patient, obtaining a medical history , reviewing a separately obtained history, performing a medically appropriate examination and/or evaluation, counseling and educating the patient/family/caregiver, ordering medications, tests, or procedures, documenting clinical information in the electronic health record, and independently interpreting results (not separately reported/billed) and communicating results to the patient/family/caregiver  Belva November, MD [facility]   End of visit medications:  No orders of the defined types were placed in this encounter.    Current Outpatient Medications:    albuterol  (VENTOLIN  HFA) 108 (90 Base) MCG/ACT inhaler, Inhale 2 puffs into the lungs every 6 (six)  hours as needed for wheezing or shortness of breath., Disp: 18 g, Rfl: 2   atorvastatin  (LIPITOR) 20 MG tablet, TAKE 1 TABLET BY MOUTH EVERY DAY., Disp: 90 tablet, Rfl: 0   budesonide -formoterol  (SYMBICORT ) 160-4.5 MCG/ACT inhaler, Inhale 2 puffs into the lungs 2 (two) times daily., Disp: 10.2 g, Rfl: 6   rivaroxaban  (XARELTO ) 10 MG TABS tablet, Take 1 tablet (10 mg total) by mouth daily., Disp: 90 tablet, Rfl: 3   Subjective:   PATIENT ID: Gabriel Burgess GENDER: male DOB: 05-Nov-1980, MRN: [id number]  Chief Complaint  Patient presents with   Asthma    Shortness of breath on exertion and at rest. Using ventolin  more than every 6 hours. Reports Singulair has not helped with shortness of breath.     HPI  Discussed the use of AI scribe software for clinical note transcription with the patient, who gave verbal consent to proceed.  Gabriel Burgess is a 43 year old male with presumed asthma who presents with persistent shortness of breath as an acute visit. He is a patient of Dr. Tamea.  He has experienced persistent shortness of breath since August 2025, initially presenting as an inability to take a full breath. Despite treatment with steroids and antibiotics, his symptoms have not fully improved. An allergen panel with IgE was ordered showing mild allergies to pollen, and he was prescribed Symbicort  twice daily. However, he continues to experience symptoms, with some variability noted in recent weeks. Cooler air and air conditioning alleviate his symptoms, while heated environments exacerbate them. No cough, wheezing, or chest tightness is present. He uses Symbicort  two puffs twice a day and albuterol  three times a day, though he is unsure of the timing of its effectiveness. He sometimes feels better after taking Symbicort , but not consistently. He has been prescribed montelukast but is uncertain of its  impact.  He has a history of pneumonia in May 2025, treated with antibiotics and steroids,  and a past episode of similar symptoms following a suspected COVID-19 infection in early 2020. There is no personal or family history of asthma, though his father experienced similar symptoms post-COVID.  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 He paints with acrylics in his garage but has not noticed a correlation between painting and his symptoms. He has tried avoiding the garage without improvement. He walks his son to school daily, experiencing mild shortness of breath but no need to stop.  He is on Xarelto  for factor five Leiden following a blood clot in his leg a few years ago. He has had negative D-dimer tests since his pneumonia and in August 2025. An echocardiogram is scheduled for July 06, 2024.  He experiences stress and anxiety related to his breathing difficulties, feeling panicky when unable to breathe fully. No significant psychological side effects from montelukast, though he had a couple of good days without needing albuterol  when the windows were open at home.   Ancillary information including prior medications, full medical/surgical/family/social histories, and PFTs (when available) are listed below and have been reviewed.    Review of Systems  Constitutional:  Negative for chills, fever, malaise/fatigue and weight loss.  Respiratory:  Positive for shortness of breath. Negative for cough, hemoptysis, sputum production and wheezing.   Cardiovascular:  Negative for chest pain.     Objective:   Vitals:   06/01/24 1515  BP: 116/76  Pulse: 95  Temp: 97.7 F (36.5 C)  TempSrc: Temporal  SpO2: 98%  Weight: 207 lb 12.8 oz (94.3 kg)  Height: 6' 2 (1.88 m)   98% on RA BMI Readings from Last 3 Encounters:  06/01/24 26.68 kg/m  05/25/24 26.78 kg/m  04/18/24 28.35 kg/m   Wt Readings from Last 3 Encounters:  06/01/24 207 lb 12.8 oz (94.3 kg)  05/25/24 208 lb 9.6 oz (94.6 kg)  04/18/24 209 lb (94.8 kg)    Physical  Exam Constitutional:      Appearance: Normal appearance. He is not ill-appearing.  HENT:     Head: Normocephalic and atraumatic.     Mouth/Throat:     Mouth: Mucous membranes are moist.  Cardiovascular:     Rate and Rhythm: Normal rate and regular rhythm.     Pulses: Normal pulses.     Heart sounds: Normal heart sounds.  Pulmonary:     Effort: Pulmonary effort is normal.     Breath sounds: Normal breath sounds.  Abdominal:     Palpations: Abdomen is soft.     Tenderness: There is no abdominal tenderness.  Musculoskeletal:     Right lower leg: No edema.     Left lower leg: No edema.  Neurological:     General: No focal deficit present.     Mental Status: He is alert and oriented to person, place, and time. Mental status is at baseline.       Ancillary Information    Past Medical History:  Diagnosis Date   Allergy    DVT (deep venous thrombosis) (HCC)    right leg   Encounter for general adult medical examination with abnormal findings 01/02/2023   Witnessed episode of apnea 01/02/2023     Family History  Problem Relation Age of Onset   Hearing loss Mother    Anxiety disorder Father    Sleep apnea Father    Stroke Maternal Grandmother  AAA (abdominal aortic aneurysm) Maternal Grandfather    Cancer Paternal Grandmother    Cerebral aneurysm Paternal Grandfather      Past Surgical History:  Procedure Laterality Date   WISDOM TOOTH EXTRACTION      Social History   Socioeconomic History   Marital status: Married    Spouse name: Not on file   Number of children: Not on file   Years of education: Not on file   Highest education level: Bachelor's degree (e.g., BA, AB, BS)  Occupational History   Not on file  Tobacco Use   Smoking status: Former    Current packs/day: 0.50    Types: Cigarettes    Passive exposure: Past   Smokeless tobacco: Never  Vaping Use   Vaping status: Never Used  Substance and Sexual Activity   Alcohol use: Yes    Alcohol/week:  4.0 standard drinks of alcohol    Types: 2 Glasses of wine, 2 Standard drinks or equivalent per week    Comment: occassionally   Drug use: Not Currently   Sexual activity: Yes    Partners: Female    Birth control/protection: None  Other Topics Concern   Not on file  Social History Narrative   Not on file   Social Drivers of Health   Financial Resource Strain: Low Risk  (02/22/2024)   Overall Financial Resource Strain (CARDIA)    Difficulty of Paying Living Expenses: Not very hard  Food Insecurity: No Food Insecurity (02/22/2024)   Hunger Vital Sign    Worried About Running Out of Food in the Last Year: Never true    Ran Out of Food in the Last Year: Never true  Transportation Needs: No Transportation Needs (02/22/2024)   PRAPARE - Administrator, Civil Service (Medical): No    Lack of Transportation (Non-Medical): No  Physical Activity: Insufficiently Active (02/22/2024)   Exercise Vital Sign    Days of Exercise per Week: 4 days    Minutes of Exercise per Session: 30 min  Stress: Stress Concern Present (02/22/2024)   [employer] of Occupational Health - Occupational Stress Questionnaire    Feeling of Stress: To some extent  Social Connections: Moderately Isolated (02/22/2024)   Social Connection and Isolation Panel    Frequency of Communication with Friends and Family: More than three times a week    Frequency of Social Gatherings with Friends and Family: Once a week    Attends Religious Services: Patient declined    Database administrator or Organizations: No    Attends Engineer, structural: Not on file    Marital Status: Married  Intimate Partner Violence: Unknown (09/26/2022)   Received from [facility]   HITS    Physically Hurt: Not on file    Insult or Talk Down To: Not on file    Threaten Physical Harm: Not on file    Scream or Curse: Not on file     Allergies  Allergen Reactions   Amoxicillin      CBC    Component Value Date/Time    WBC 6.4 04/18/2024 1022   WBC 5.6 04/13/2024 1040   RBC 4.93 04/18/2024 1022   HGB 15.0 04/18/2024 1022   HCT 43.4 04/18/2024 1022   PLT 336 04/18/2024 1022   MCV 88.0 04/18/2024 1022   MCH 30.4 04/18/2024 1022   MCHC 34.6 04/18/2024 1022   RDW 12.0 04/18/2024 1022   LYMPHSABS 1.6 04/18/2024 1022   MONOABS 0.4 04/18/2024 1022  EOSABS 0.1 04/18/2024 1022   BASOSABS 0.0 04/18/2024 1022    Pulmonary Functions Testing Results:    Latest Ref Rng & Units 04/19/2024   10:43 AM  PFT Results  FVC-Pre L 6.09   FVC-Predicted Pre % 102   FVC-Post L 6.08   FVC-Predicted Post % 102   Pre FEV1/FVC % % 75   Post FEV1/FCV % % 77   FEV1-Pre L 4.57   FEV1-Predicted Pre % 98   FEV1-Post L 4.66   DLCO uncorrected ml/min/mmHg 39.83   DLCO UNC% % 115   DLCO corrected ml/min/mmHg 39.39   DLCO COR %Predicted % 114   DLVA Predicted % 108   TLC L 8.65   TLC % Predicted % 111   RV % Predicted % 119     Outpatient Medications Prior to Visit  Medication Sig Dispense Refill   albuterol  (VENTOLIN  HFA) 108 (90 Base) MCG/ACT inhaler Inhale 2 puffs into the lungs every 6 (six) hours as needed for wheezing or shortness of breath. 18 g 2   atorvastatin  (LIPITOR) 20 MG tablet TAKE 1 TABLET BY MOUTH EVERY DAY. 90 tablet 0   budesonide -formoterol  (SYMBICORT ) 160-4.5 MCG/ACT inhaler Inhale 2 puffs into the lungs 2 (two) times daily. 10.2 g 6   rivaroxaban  (XARELTO ) 10 MG TABS tablet Take 1 tablet (10 mg total) by mouth daily. 90 tablet 3   montelukast (SINGULAIR) 10 MG tablet Take 1 tablet (10 mg total) by mouth at bedtime. 30 tablet 2   No facility-administered medications prior to visit.

## 2024-06-01 NOTE — Telephone Encounter (Signed)
 Noted, NFN, pt scheduled to see Dr. Isadora today at 3:15

## 2024-06-01 NOTE — Telephone Encounter (Signed)
 FYI Only or Action Required?: FYI only for provider.  Patient is followed in Pulmonology for Asthma , last seen on 05/25/2024 by Gabriel Burgess ORN, NP.  Called Nurse Triage reporting Shortness of Breath.  Symptoms began several months ago.  Interventions attempted: Prescription medications: Singulair , Rescue inhaler, and Maintenance inhaler.  Symptoms are: gradually worsening.  Triage Disposition: See HCP Within 4 Hours (Or PCP Triage)  Patient/caregiver understands and will follow disposition?: Yes  E2C2 Pulmonary Triage - Initial Assessment Questions "Chief Complaint (e.g., cough, sob, wheezing, fever, chills, sweat or additional symptoms) *Go to specific symptom protocol after initial questions. Difficulty breathing   "How long have symptoms been present?" Since August  Have you tested for COVID or Flu? Note: If not, ask patient if Burgess home test can be taken. If so, instruct patient to call back for positive results. Yes  MEDICINES:   "Have you used any OTC meds to help with symptoms?" No If yes, ask "What medications?" NA  "Have you used your inhalers/maintenance medication?" Yes If yes, "What medications?" Albuterol  3x yesterday, twice today already- helps some Symbicort  BID   If inhaler, ask "How many puffs and how often?" Note: Review instructions on medication in the chart. No use Monday but 3x Tuesday and twice already today   OXYGEN: "Do you wear supplemental oxygen?" No If yes, "How many liters are you supposed to use?" NA  "Do you monitor your oxygen levels?" No If yes, What is your reading (oxygen level) today? NA  What is your usual oxygen saturation reading?  (Note: Pulmonary O2 sats should be 90% or greater) normal   Copied from CRM #[id number]. Topic: Clinical - Red Word Triage >> Jun 01, 2024  9:51 AM Gabriel Burgess wrote: Red Word that prompted transfer to Nurse Triage: Medication does not seem to be helping - Can't take Burgess full breathe, states he  recently got diagnosed with asthma.       albuterol  (VENTOLIN  HFA) 108 (90 Base) MCG/ACT inhaler   montelukast (SINGULAIR) 10 MG tablet [[id number]]   budesonide -formoterol  (SYMBICORT ) 160-4.5 MCG/ACT inhaler [[id number]] Reason for Disposition  [1] Longstanding difficulty breathing (e.g., CHF, COPD, emphysema) AND [2] WORSE than normal  Answer Assessment - Initial Assessment Questions Since August- Still feeling SOB at rest. Was starting to feel better with the cooler weather on Monday  with no rescue inhaler use but is struggling with the heat on. He is going outside in the cooler air for some relief.   D-Dimer has always been negative-Factor V. Denies fever or CP. SOB consistent with the past few months.  Monday didn't need his albuterol  inhaler but has had to use 3x on Tues and twice already this morning.   ED/UC/Call back instructions given and understood. Appt for this afternoon to discuss further.   1. RESPIRATORY STATUS: Describe your breathing? (e.g., wheezing, shortness of breath, unable to speak, severe coughing)      Hard to take Burgess full breath  2. ONSET: When did this breathing problem begin?      Since august 3. PATTERN Does the difficult breathing come and go, or has it been constant since it started?      Some good  4. SEVERITY: How bad is your breathing? (e.g., mild, moderate, severe)      SOB at rest  5. RECURRENT SYMPTOM: Have you had difficulty breathing before? If Yes, ask: When was the last time? and What happened that time?      Yes worse when hot 6.  CARDIAC HISTORY: Do you have any history of heart disease? (e.g., heart attack, angina, bypass surgery, angioplasty)      Factor V with DVT- on Xarelto   7. LUNG HISTORY: Do you have any history of lung disease?  (e.g., pulmonary embolus, asthma, emphysema)     Previous smoker 8. CAUSE: What do you think is causing the breathing problem?      humidity 9. OTHER SYMPTOMS: Do you have any other  symptoms? (e.g., chest pain, cough, dizziness, fever, runny nose)     Denies- had dizziness prior to last Appt.  10. O2 SATURATION MONITOR:  Do you use an oxygen saturation monitor (pulse oximeter) at home? If Yes, ask: What is your reading (oxygen level) today? What is your usual oxygen saturation reading? (e.g., 95%)       Doesn't have monitor 12. TRAVEL: Have you traveled out of the country in the last month? (e.g., travel history, exposures)       denies  Protocols used: Breathing Difficulty-Burgess-AH

## 2024-06-02 ENCOUNTER — Other Ambulatory Visit: Payer: Self-pay | Admitting: Family Medicine

## 2024-06-02 DIAGNOSIS — E785 Hyperlipidemia, unspecified: Secondary | ICD-10-CM

## 2024-06-03 ENCOUNTER — Telehealth: Payer: Self-pay | Admitting: Primary Care

## 2024-06-03 NOTE — Addendum Note (Signed)
 Addended by: HOPE ALMARIE ORN on: 06/03/2024 04:04 PM   Modules accepted: Orders

## 2024-06-03 NOTE — Telephone Encounter (Signed)
 Per [facility]----Order for ONO needs to state how to administer if on room air or if its with O2 or on cpap. Can you please correct ono order and resend so we can complete.  Please advise

## 2024-06-03 NOTE — Telephone Encounter (Signed)
 Updated, thanks

## 2024-06-07 ENCOUNTER — Ambulatory Visit (HOSPITAL_COMMUNITY)
Admission: RE | Admit: 2024-06-07 | Discharge: 2024-06-07 | Disposition: A | Discharge status: Home / Self Care | Source: Ambulatory Visit | Attending: Student in an Organized Health Care Education/Training Program | Admitting: Student in an Organized Health Care Education/Training Program

## 2024-06-07 DIAGNOSIS — R0602 Shortness of breath: Secondary | ICD-10-CM | POA: Insufficient documentation

## 2024-06-07 DIAGNOSIS — Z0189 Encounter for other specified special examinations: Secondary | ICD-10-CM | POA: Diagnosis not present

## 2024-06-07 DIAGNOSIS — J45909 Unspecified asthma, uncomplicated: Secondary | ICD-10-CM | POA: Diagnosis not present

## 2024-06-07 MED ORDER — IOHEXOL 350 MG/ML SOLN
75.0000 mL | Freq: Once | INTRAVENOUS | Status: AC | PRN
  Administered 2024-06-07: 75 mL via INTRAVENOUS

## 2024-06-10 ENCOUNTER — Encounter: Payer: Self-pay | Admitting: Family Medicine

## 2024-06-10 DIAGNOSIS — E785 Hyperlipidemia, unspecified: Secondary | ICD-10-CM

## 2024-06-10 MED ORDER — ATORVASTATIN CALCIUM 20 MG PO TABS: 20.0000 mg | ORAL_TABLET | Freq: Every day | ORAL | 1 refills | Status: AC

## 2024-06-10 NOTE — Addendum Note (Signed)
 Addended by: Reed Eifert E on: 06/10/2024 09:47 AM   Modules accepted: Orders

## 2024-06-14 ENCOUNTER — Telehealth: Payer: Self-pay

## 2024-06-14 ENCOUNTER — Ambulatory Visit (INDEPENDENT_AMBULATORY_CARE_PROVIDER_SITE_OTHER): Admitting: Gastroenterology

## 2024-06-14 ENCOUNTER — Encounter: Payer: Self-pay | Admitting: Gastroenterology

## 2024-06-14 VITALS — BP 114/70 | HR 67 | Ht 74.0 in | Wt 210.4 lb

## 2024-06-14 DIAGNOSIS — R1319 Other dysphagia: Secondary | ICD-10-CM | POA: Diagnosis not present

## 2024-06-14 NOTE — Telephone Encounter (Signed)
 Letter faxed to Dr Federico to hold Xarelto  2 days prior to EGD

## 2024-06-14 NOTE — Patient Instructions (Signed)
 You have been scheduled for an endoscopy. Please follow written instructions given to you at your visit today.  If you use inhalers (even only as needed), please bring them with you on the day of your procedure.  If you take any of the following medications, they will need to be adjusted prior to your procedure:   DO NOT TAKE 7 DAYS PRIOR TO TEST- Trulicity (dulaglutide) Ozempic, Wegovy (semaglutide) Mounjaro (tirzepatide) Bydureon Bcise (exanatide extended release)  DO NOT TAKE 1 DAY PRIOR TO YOUR TEST Rybelsus (semaglutide) Adlyxin (lixisenatide) Victoza (liraglutide) Byetta (exanatide) ___________________________________________________________________________   Gabriel Burgess will be contacted by our office prior to your procedure for directions on holding your Xarelto .  If you do not hear from our office 1 week prior to your scheduled procedure, please call [phone] to discuss.

## 2024-06-14 NOTE — Progress Notes (Signed)
  Gastroenterology Consult Note:  History: Gabriel Burgess 06/14/2024  Referring provider: Lendia Boby CROME, NP-C  Reason for consult/chief complaint: Dysphagia (Pt states he has trouble swallowing meat only foods )   Subjective  Prior history: No prior GI history    Discussed the use of AI scribe software for clinical note transcription with the patient, who gave verbal consent to proceed.  History of Present Illness Gabriel Burgess is a 43 year old male who presents with swallowing difficulties. He was referred by his primary care physician for evaluation of swallowing difficulties.  He has experienced swallowing difficulties, particularly with solid foods, for approximately seven to eight years. The sensation is described as food getting 'stuck' in the upper chest area, which usually resolves after a few seconds but occasionally requires him to induce vomiting to relieve the obstruction. This occurs every few months and is more common with drier meats like white meat chicken. Drinking liquids to push down the food sometimes exacerbates the problem, causing pain when the liquid also gets stuck.  No significant weight loss, regurgitation, or frequent heartburn requiring antacids. He has not noticed any undigested food upon waking or any allergic reactions such as tongue or lip swelling, hives, or rashes. There is no known family history of esophagus problems.  He has a history of asthma, experiencing shortness of breath, particularly in hot, humid conditions or when the heat is on indoors. Cooler air provides relief. No cough or sputum production. He recently underwent a CT scan and is scheduled for an echocardiogram later this month. He has a history of a blood clot. He is currently on a blood thinner.  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 He moved to the area seven years ago with his wife, who works at [employer], and he has a  four-year-old son.  Has recently seen both his hematology and pulmonary consultants (see below) Echocardiogram scheduled for 07/06/2024, arranged by pulmonary for patient's dyspnea   ROS:  Review of Systems  Constitutional:  Negative for appetite change and unexpected weight change.  HENT:  Negative for mouth sores and voice change.   Eyes:  Negative for pain and redness.  Respiratory:  Positive for shortness of breath. Negative for cough.   Cardiovascular:  Negative for chest pain and palpitations.  Genitourinary:  Negative for dysuria and hematuria.  Musculoskeletal:  Negative for arthralgias and myalgias.  Skin:  Negative for pallor and rash.  Neurological:  Negative for weakness and headaches.  Hematological:  Negative for adenopathy.     Past Medical History: Past Medical History:  Diagnosis Date   Allergy    Asthma 2025   DVT (deep venous thrombosis) (HCC)    right leg   Encounter for general adult medical examination with abnormal findings 01/02/2023   Witnessed episode of apnea 01/02/2023   From 04/18/2024 hematology office note: Hematological/Oncological History # Family History of Factor V Leiden # Unprovoked Right Lower Extremity DVT  10/06/2021: Patient underwent a right lower extremity ultrasound which showed an acute deep vein thrombosis involving the right  popliteal vein, right posterior tibial veins, and right peroneal veins.  Same-day CT PE study showed no evidence of pulmonary embolism. 12/22/2021: Establish care with Dr. Federico   Interval History:  Gabriel Burgess 43 y.o. 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 The patient's last visit was on 10/06/2023.  In the interim since the last visit he has continued on Xarelto  therapy as prescribed  ___________________________________  From 10 20-25 pulmonary clinic note: Persistent shortness of breath  since August 2025, initially suspected to be asthma, shows normal PFTs and no significant improvement with Symbicort  and montelukast. Symptoms fluctuate with environmental changes and improve in cooler air. No wheezing or cough present. Differential includes asthma, environmental triggers, and deconditioning. Continue Symbicort  twice daily, discontinue Singulair, and order a chest CT. Proceed with the scheduled echocardiogram. Advise a graded exercise program to improve conditioning and avoid exposure to acrylic paints and fumes for a few weeks.   Past Surgical History: Past Surgical History:  Procedure Laterality Date   WISDOM TOOTH EXTRACTION       Family History: Family History  Problem Relation Age of Onset   Hearing loss Mother    Anxiety disorder Father    Sleep apnea Father    Stroke Maternal Grandmother    AAA (abdominal aortic aneurysm) Maternal Grandfather    Cancer Paternal Grandmother    Cerebral aneurysm Paternal Grandfather     Social History: Social History   Socioeconomic History   Marital status: Married    Spouse name: Not on file   Number of children: Not on file   Years of education: Not on file   Highest education level: Bachelor's degree (e.g., BA, AB, BS)  Occupational History   Not on file  Tobacco Use   Smoking status: Former    Current packs/day: 0.50    Types: Cigarettes    Passive exposure: Past   Smokeless tobacco: Never  Vaping Use   Vaping status: Never Used  Substance and Sexual Activity   Alcohol use: Yes    Alcohol/week: 4.0 standard drinks of alcohol    Types: 2 Glasses of wine, 2 Standard drinks or equivalent per week    Comment: occassionally   Drug use: Not Currently   Sexual activity: Yes    Partners: Female    Birth control/protection: None  Other Topics Concern   Not on file  Social History Narrative   Not on file   Social Drivers of Health   Financial Resource Strain: Low Risk  (02/22/2024)   Overall Financial Resource  Strain (CARDIA)    Difficulty of Paying Living Expenses: Not very hard  Food Insecurity: No Food Insecurity (02/22/2024)   Hunger Vital Sign    Worried About Running Out of Food in the Last Year: Never true    Ran Out of Food in the Last Year: Never true  Transportation Needs: No Transportation Needs (02/22/2024)   PRAPARE - Administrator, Civil Service (Medical): No    Lack of Transportation (Non-Medical): No  Physical Activity: Insufficiently Active (02/22/2024)   Exercise Vital Sign    Days of Exercise per Week: 4 days    Minutes of Exercise per Session: 30 min  Stress: Stress Concern Present (02/22/2024)   [employer] of Occupational Health - Occupational Stress Questionnaire    Feeling of Stress: To some extent  Social Connections: Moderately Isolated (02/22/2024)   Social Connection and Isolation Panel    Frequency of Communication with Friends and Family: More than three times a week    Frequency of Social Gatherings with Friends and Family: Once a week    Attends Religious Services: Patient declined    Database Administrator or Organizations: No    Attends Engineer, Structural: Not on file    Marital Status: Married  Allergies: Allergies  Allergen Reactions   Amoxicillin     Outpatient Meds: Current Outpatient Medications  Medication Sig Dispense Refill   albuterol  (VENTOLIN  HFA) 108 (90 Base) MCG/ACT inhaler Inhale 2 puffs into the lungs every 6 (six) hours as needed for wheezing or shortness of breath. 18 g 2   atorvastatin  (LIPITOR) 20 MG tablet Take 1 tablet (20 mg total) by mouth daily. 90 tablet 1   budesonide -formoterol  (SYMBICORT ) 160-4.5 MCG/ACT inhaler Inhale 2 puffs into the lungs 2 (two) times daily. 10.2 g 6   rivaroxaban  (XARELTO ) 10 MG TABS tablet Take 1 tablet (10 mg total) by mouth daily. 90 tablet 3   No current facility-administered medications for this visit.       ___________________________________________________________________ Objective   Exam:  BP 114/70   Pulse 67   Ht 6' 2 (1.88 m)   Wt 210 lb 6 oz (95.4 kg)   BMI 27.01 kg/m  Wt Readings from Last 3 Encounters:  06/14/24 210 lb 6 oz (95.4 kg)  06/01/24 207 lb 12.8 oz (94.3 kg)  05/25/24 208 lb 9.6 oz (94.6 kg)    General: Well-appearing, normal vocal quality Eyes: sclera anicteric, no redness ENT: oral mucosa moist without lesions, no cervical or supraclavicular lymphadenopathy CV: Regular without appreciable murmur, no JVD, no peripheral edema Resp: clear to auscultation bilaterally, normal RR and effort noted GI: soft, no tenderness, with active bowel sounds. No guarding or palpable organomegaly noted. Skin; warm and dry, no rash or jaundice noted Neuro: awake, alert and oriented x 3. Normal gross motor function and fluent speech   Radiologic Studies:  No GI imaging on file ___________________________________   CT angiogram chest 06/07/2024:  Narrative & Impression  EXAM: CTA of the Chest with contrast for PE 06/07/2024 07:41:47 AM   TECHNIQUE: CTA of the chest was performed after the administration of intravenous contrast. Multiplanar reformatted images are provided for review. MIP images are provided for review. Automated exposure control, iterative reconstruction, and/or weight based adjustment of the mA/kV was utilized to reduce the radiation dose to as low as reasonably achievable. The study is limited by respiratory motion and suboptimal timing of the contrast bolus.   COMPARISON: CT of the thorax dated 10/06/2021.   CLINICAL HISTORY:   FINDINGS:   PULMONARY ARTERIES: Pulmonary arteries are adequately opacified for evaluation. There is no convincing evidence of pulmonary embolic disease. Main pulmonary artery is normal in caliber.   MEDIASTINUM: The heart and pericardium demonstrate no acute abnormality. There is no acute abnormality of  the thoracic aorta.   LYMPH NODES: No mediastinal, hilar or axillary lymphadenopathy.   LUNGS AND PLEURA: The lungs are without acute process. No focal consolidation or pulmonary edema. No pleural effusion or pneumothorax.   UPPER ABDOMEN: Limited images of the upper abdomen are unremarkable.   SOFT TISSUES AND BONES: No acute bone or soft tissue abnormality.   IMPRESSION: 1. No convincing evidence of pulmonary embolic disease. 2. Study limited by respiratory motion and suboptimal contrast bolus timing.   Electronically signed by: Evalene Coho MD 06/07/2024 07:48 AM EDT RP Workstation: [id number]    Encounter Diagnosis  Name Primary?   Esophageal dysphagia Yes    Assessment and Plan Assessment & Plan Dysphagia Chronic dysphagia with food impaction, differential includes eosinophilic esophagitis, esophageal strictures, and motility disorders. No significant weight loss or regurgitation. Possible allergic component considered. - Schedule upper endoscopy to evaluate esophagus and rule out eosinophilic esophagitis and other conditions. - Coordinate with cardiologist for echocardiogram before  endoscopy due to sedation. - Communicate with hematologist regarding 2-day hold of blood thinner prior to endoscopy. - Plan for potential esophageal dilation during endoscopy if significant narrowing is found. - Discussed risks of endoscopy including bleeding, perforation, and anesthesia-related complications.  EGD scheduled for 08/01/2024, thus allowing for cardiology interpretation of his upcoming echocardiogram and subsequent reviewed by our anesthesia staff.   Plan:  He was agreeable to an EGD after discussion of procedure and risks.  The benefits and risks of the planned procedure(s) were described in detail with the patient or (when appropriate) their health care proxy.  Risks were outlined as including, but not limited to, bleeding, infection, perforation, adverse medication  reaction leading to cardiac or pulmonary decompensation, pancreatitis (if ERCP).  The limitation of incomplete mucosal visualization was also discussed.  No guarantees or warranties were given.   Thank you for the courtesy of this consult.  Please call me with any questions or concerns.  Victory LITTIE Brand III  CC: Referring provider noted above

## 2024-07-05 NOTE — Telephone Encounter (Signed)
  Gabriel Burgess [birth year] [id number]  @DATE @   Dear Dr. Federico:  We have scheduled the above named patient for a(n) upper endoscopy procedure. Our records show that (s)he is on anticoagulation therapy.  Please advise as to whether the patient may come off their therapy of Xarelto  2 days prior to their procedure which is scheduled for 08/01/24.  Please route your response to Powell Misty, CMA or fax response to [phone].  Sincerely,   Powell Misty, [facility]

## 2024-07-06 ENCOUNTER — Ambulatory Visit (HOSPITAL_COMMUNITY)
Admission: RE | Admit: 2024-07-06 | Discharge: 2024-07-06 | Disposition: A | Discharge status: Home / Self Care | Source: Ambulatory Visit | Attending: Cardiology | Admitting: Cardiology

## 2024-07-06 DIAGNOSIS — R0602 Shortness of breath: Secondary | ICD-10-CM | POA: Insufficient documentation

## 2024-07-06 LAB — ECHOCARDIOGRAM COMPLETE
Area-P 1/2: 3.58 cm2
S' Lateral: 3.36 cm

## 2024-07-11 NOTE — Telephone Encounter (Signed)
Patient informed he may hold Xarelto.

## 2024-07-12 ENCOUNTER — Encounter: Payer: Self-pay | Admitting: Pulmonary Disease

## 2024-07-12 ENCOUNTER — Ambulatory Visit: Admitting: Pulmonary Disease

## 2024-07-12 VITALS — BP 112/62 | HR 74 | Temp 97.7°F | Ht 74.0 in | Wt 209.0 lb

## 2024-07-12 DIAGNOSIS — Z7901 Long term (current) use of anticoagulants: Secondary | ICD-10-CM | POA: Diagnosis not present

## 2024-07-12 DIAGNOSIS — J4540 Moderate persistent asthma, uncomplicated: Secondary | ICD-10-CM | POA: Diagnosis not present

## 2024-07-12 DIAGNOSIS — R0681 Apnea, not elsewhere classified: Secondary | ICD-10-CM | POA: Diagnosis not present

## 2024-07-12 DIAGNOSIS — D6851 Activated protein C resistance: Secondary | ICD-10-CM | POA: Diagnosis not present

## 2024-07-12 DIAGNOSIS — Z9189 Other specified personal risk factors, not elsewhere classified: Secondary | ICD-10-CM

## 2024-07-12 LAB — NITRIC OXIDE: Nitric Oxide: 23

## 2024-07-12 MED ORDER — TRELEGY ELLIPTA 200-62.5-25 MCG/ACT IN AEPB: 1.0000 | INHALATION_SPRAY | Freq: Every day | RESPIRATORY_TRACT | 11 refills | Status: AC

## 2024-07-12 NOTE — Patient Instructions (Addendum)
 VISIT SUMMARY:  You came in today because of shortness of breath and possible sleep apnea. Your shortness of breath has improved since your last visit but has worsened slightly over the past few days. You also mentioned that you do not feel refreshed upon waking and your wife has observed that you may stop breathing at times during sleep.  YOUR PLAN:  -SHORTNESS OF BREATH DUE TO ASTHMA: Asthma is a condition where your airways become inflamed and narrow, making it hard to breathe. Your shortness of breath has improved but has recently worsened, possibly due to humidity and allergies. We have switched you to a once-daily inhaler (Trelegy Ellipta) with three medications to help improve your symptom control.  Make sure to rinse your mouth well after you use the Trelegy.  Discontinue Symbicort .  -SUSPECTED OBSTRUCTIVE SLEEP APNEA: Obstructive sleep apnea is a condition where your breathing stops and starts during sleep. You have symptoms that suggest this condition, such as not feeling refreshed in the morning and observed pauses in breathing during sleep. We have ordered a new home sleep study for three nights to assess for obstructive sleep apnea.  - FACTOR V LEIDEN WITH PRIOR DVT/PE: Continue Xarelto , continue follow-up with hematology.  CT performed in October showed no PE.  INSTRUCTIONS:  Please follow up after completing the home sleep study. Continue using your new inhaler as prescribed. If your symptoms worsen or you have any concerns, contact our office.

## 2024-07-12 NOTE — Progress Notes (Signed)
 Subjective:    Patient ID: Gabriel Burgess, male    DOB: 02/21/1981, 43 y.o.   MRN: [id number]  Patient Care Team: Lendia Boby CROME, NP-C as PCP - [facility])  Chief Complaint  Patient presents with   Asthma    No cough or wheezing. Shortness of breath, mostly due to heat.     BACKGROUND/INTERVAL:Patient is a 44 year old remote former smoker, with minimal tobacco use in the past, and a history as noted below who presents for follow-up of dyspnea.  Initially seen here on 13 April 2024.  Last follow-up 01 June 2024 seen by Dr. Belva November.  HPI Discussed the use of AI scribe software for clinical note transcription with the patient, who gave verbal consent to proceed.  History of Present Illness   Gabriel Burgess is a 43 year old male with asthma who presents with shortness of breath.  Shortness of breath has improved since the last visit but has started to worsen slightly over the past few days. It is not as severe as it was during the summer. He suspects that humidity may be a contributing factor. He continues to use Symbicort  and is generally consistent with using the inhaler twice a day, although he occasionally forgets at night.  He is currently taking Xarelto  as he is factor V Leiden positive and has a history of DVT/suspect PE.  He does not report significant sleep issues but mentions a previous at-home sleep apnea test that was unsuccessful. His wife has observed that he may stop breathing at times during sleep. He does not feel refreshed upon waking in the morning.     DATA 10/06/2021 CT angio chest: Possible distal segmental/subsegmental emboli right upper and lower lobes. 03/17/2024 CXR PA and lateral: No active pulmonary disease. 04/13/2024 eosinophils: 200 cells per uL. 04/13/2024 allergen panel/IgE: Low sensitivity to white oak and maple/Box Elder (class I), IgE 8 IU/mL. 04/19/2024 PFTs: FEV1 4.57 L or 98% predicted, FVC 6.09 L or 102% predicted,  FEV1/FVC 75%, no bronchodilator response.  Lung volumes are normal.  Diffusion capacity normal. Normal PFTs. 06/07/2024 CT angio chest: No evidence of pulmonary embolic disease.  Review of Systems A 10 point review of systems was performed and it is as noted above otherwise negative.   Patient Active Problem List   Diagnosis Date Noted   Acute URI 03/26/2024   Prediabetes 03/26/2024   SOB (shortness of breath) 03/24/2024   Esophageal dysphagia 02/24/2024   B12 deficiency 07/16/2023   Snores 01/02/2023   Hearing decreased, bilateral 01/02/2023   Witnessed episode of apnea 01/02/2023   Bilateral cold feet 01/02/2023   Thrombocytosis 01/02/2023   Encounter for general adult medical examination with abnormal findings 01/02/2023   Factor V Leiden 12/12/2022   On anticoagulant therapy 12/12/2022   Chronic bilateral low back pain without sciatica 12/12/2022   Hyperlipidemia 11/01/2021   Acute deep vein thrombosis (DVT) of right peroneal vein (HCC) 10/08/2021    Social History   Tobacco Use   Smoking status: Former    Current packs/day: 0.50    Types: Cigarettes    Passive exposure: Past   Smokeless tobacco: Never  Substance Use Topics   Alcohol use: Yes    Alcohol/week: 4.0 standard drinks of alcohol    Types: 2 Glasses of wine, 2 Standard drinks or equivalent per week    Comment: occassionally    Allergies  Allergen Reactions   Amoxicillin     Current Meds  Medication Sig   albuterol  (VENTOLIN   HFA) 108 (90 Base) MCG/ACT inhaler Inhale 2 puffs into the lungs every 6 (six) hours as needed for wheezing or shortness of breath.   atorvastatin  (LIPITOR) 20 MG tablet Take 1 tablet (20 mg total) by mouth daily.   Fluticasone-Umeclidin-Vilant (TRELEGY ELLIPTA ) 200-62.5-25 MCG/ACT AEPB Inhale 1 puff into the lungs daily.   rivaroxaban  (XARELTO ) 10 MG TABS tablet Take 1 tablet (10 mg total) by mouth daily.   [DISCONTINUED] budesonide -formoterol  (SYMBICORT ) 160-4.5 MCG/ACT inhaler  Inhale 2 puffs into the lungs 2 (two) times daily.    Immunization History  Administered Date(s) Administered   PFIZER(Purple Top)SARS-COV-2 Vaccination 10/28/2019, 11/22/2019, 07/18/2020   Pfizer Covid-19 Vaccine Bivalent Booster 68yrs & up 08/07/2021   Pfizer(Comirnaty)Fall Seasonal Vaccine 12 years and older 03/13/2024   Tdap 04/15/2020        Objective:     Vitals:   07/12/24 0946  BP: 112/62  Pulse: 74  Temp: 97.7 F (36.5 C)  Height: 6' 2 (1.88 m)  Weight: 209 lb (94.8 kg)  SpO2: 98%  TempSrc: Temporal  BMI (Calculated): 26.82     SpO2: 98 %  GENERAL: Well-developed, well-nourished gentleman, no acute distress.  Fully ambulatory, mild conversational dyspnea. HEAD: Normocephalic, atraumatic.  EYES: Pupils equal, round, reactive to light.  No scleral icterus.  MOUTH: Dentition intact, oral mucosa moist.  No thrush. NECK: Supple. No thyromegaly. Trachea midline. No JVD.  No adenopathy. PULMONARY: Good air entry bilaterally.  No adventitious sounds. CARDIOVASCULAR: S1 and S2. Regular rate and rhythm.  No rubs, murmurs or gallops heard. ABDOMEN: Benign. MUSCULOSKELETAL: No joint deformity, no clubbing, no edema.  NEUROLOGIC: No overt focal deficit, no gait disturbance, speech is fluent. SKIN: Intact,warm,dry. PSYCH: Mood and behavior normal.   Lab Results  Component Value Date   NITRICOXIDE 23 07/12/2024  *Trend: 40 >> 22 >>23  This result suggests low (<25) Type 2 (T2) airway inflammation indicating a low likelihood of active T2-driven airway inflammation; reduced probability of response to inhaled corticosteroids.      07/12/2024   10:13 AM  Results of the Epworth flowsheet  Sitting and reading 0  Watching TV 0  Sitting, inactive in a public place (e.g. a theatre or a meeting) 0  As a passenger in a car for an hour without a break 1  Lying down to rest in the afternoon when circumstances permit 1  Sitting and talking to someone 0  Sitting quietly after  a lunch without alcohol 0  In a car, while stopped for a few minutes in traffic 0  Total score 2    Assessment & Plan:     ICD-10-CM   1. Moderate persistent asthma without complication  J45.40 Nitric oxide     2. Factor V Leiden  D68.51     3. On anticoagulant therapy  Z79.01     4. Witnessed episode of apnea  R06.81     5. At risk for sleep apnea  Z91.89       Orders Placed This Encounter  Procedures   Nitric oxide     Meds ordered this encounter  Medications   Fluticasone-Umeclidin-Vilant (TRELEGY ELLIPTA ) 200-62.5-25 MCG/ACT AEPB    Sig: Inhale 1 puff into the lungs daily.    Dispense:  60 each    Refill:  11   Discussion:    Shortness of breath due to asthma Intermittent shortness of breath, improved since last visit but recently worsening, possibly related to humidity and allergies.  T2 mediated airway inflammation levels are low. Current inhaler  regimen includes Symbicort , used mostly consistently twice daily, with occasional missed doses at night. - Switched to a once-daily inhaler with three medications to improve symptom control (Trelegy).  Suspected obstructive sleep apnea Reports of possible sleep apnea symptoms, including non-refreshing sleep and observed apneas by spouse. Previous home sleep apnea test was unsuccessful. Newer home sleep study tests are available, involving a three-night monitoring period for a comprehensive assessment. - Ordered a new home sleep study for three nights to assess for obstructive sleep apnea.      Advised if symptoms do not improve or worsen, to please contact office for sooner follow up or seek emergency care.    I spent 34 minutes of dedicated to the care of this patient on the date of this encounter to include pre-visit review of records, face-to-face time with the patient discussing conditions above, post visit ordering of testing, clinical documentation with the electronic health record, making appropriate referrals as  documented, and communicating necessary findings to members of the patients care team.     C. Leita Sanders, MD Advanced Bronchoscopy P[facility] [facility]    *This note was generated using voice recognition software/Dragon and/or AI transcription program.  Despite best efforts to proofread, errors can occur which can change the meaning. Any transcriptional errors that result from this process are unintentional and may not be fully corrected at the time of dictation.

## 2024-07-21 ENCOUNTER — Encounter

## 2024-07-21 DIAGNOSIS — Z9189 Other specified personal risk factors, not elsewhere classified: Secondary | ICD-10-CM

## 2024-07-21 DIAGNOSIS — R0681 Apnea, not elsewhere classified: Secondary | ICD-10-CM

## 2024-07-21 DIAGNOSIS — G4730 Sleep apnea, unspecified: Secondary | ICD-10-CM | POA: Diagnosis not present

## 2024-07-26 ENCOUNTER — Encounter: Payer: Self-pay | Admitting: Gastroenterology

## 2024-08-01 ENCOUNTER — Ambulatory Visit: Admitting: Gastroenterology

## 2024-08-01 ENCOUNTER — Encounter: Payer: Self-pay | Admitting: Gastroenterology

## 2024-08-01 VITALS — BP 119/76 | HR 69 | Temp 97.3°F | Resp 9 | Ht 74.0 in | Wt 210.0 lb

## 2024-08-01 DIAGNOSIS — K222 Esophageal obstruction: Secondary | ICD-10-CM

## 2024-08-01 DIAGNOSIS — R1310 Dysphagia, unspecified: Secondary | ICD-10-CM

## 2024-08-01 DIAGNOSIS — R1319 Other dysphagia: Secondary | ICD-10-CM

## 2024-08-01 DIAGNOSIS — K2270 Barrett's esophagus without dysplasia: Secondary | ICD-10-CM

## 2024-08-01 MED ORDER — SODIUM CHLORIDE 0.9 % IV SOLN: 500.0000 mL | Freq: Once | INTRAVENOUS | Status: DC

## 2024-08-01 MED ORDER — OMEPRAZOLE 40 MG PO CPDR: 40.0000 mg | DELAYED_RELEASE_CAPSULE | Freq: Two times a day (BID) | ORAL | 6 refills | Status: AC

## 2024-08-01 NOTE — Progress Notes (Signed)
 Per MD- prescribe Omeprazole  40 mg BID, #60, 6 refills  Per MD- ok to resume normal diet

## 2024-08-01 NOTE — Progress Notes (Signed)
 Called to room to assist during endoscopic procedure.  Patient ID and intended procedure confirmed with present staff. Received instructions for my participation in the procedure from the performing physician.

## 2024-08-01 NOTE — Progress Notes (Signed)
 History and Physical:  This patient presents for endoscopic testing for: Encounter Diagnosis  Name Primary?   Esophageal dysphagia Yes    43 year old male here today for endoscopic eval ration of esophageal dysphagia, clinical details of which are outlined in my 11//25 office note. Subsequent unremarkable echocardiogram 07/06/2024.  Patient is otherwise without complaints or active issues today.   Past Medical History: Past Medical History:  Diagnosis Date   Allergy    Asthma 2025   DVT (deep venous thrombosis) (HCC)    right leg   Encounter for general adult medical examination with abnormal findings 01/02/2023   Hyperlipidemia    Witnessed episode of apnea 01/02/2023     Past Surgical History: Past Surgical History:  Procedure Laterality Date   WISDOM TOOTH EXTRACTION      Allergies: Allergies[1]  Outpatient Meds: Current Outpatient Medications  Medication Sig Dispense Refill   albuterol  (VENTOLIN  HFA) 108 (90 Base) MCG/ACT inhaler Inhale 2 puffs into the lungs every 6 (six) hours as needed for wheezing or shortness of breath. 18 g 2   atorvastatin  (LIPITOR) 20 MG tablet Take 1 tablet (20 mg total) by mouth daily. 90 tablet 1   cyanocobalamin  (VITAMIN B12) 1000 MCG tablet Take 1,000 mcg by mouth daily.     Fluticasone-Umeclidin-Vilant (TRELEGY ELLIPTA ) 200-62.5-25 MCG/ACT AEPB Inhale 1 puff into the lungs daily. 60 each 11   rivaroxaban  (XARELTO ) 10 MG TABS tablet Take 1 tablet (10 mg total) by mouth daily. 90 tablet 3   Current Facility-Administered Medications  Medication Dose Route Frequency Provider Last Rate Last Admin   0.9 %  sodium chloride  infusion  500 mL Intravenous Once Danis, Victory CROME III, MD          ___________________________________________________________________ Objective   Exam:  BP (!) 150/78   Pulse 73   Temp (!) 97.3 F (36.3 C) (Temporal)   Ht 6' 2 (1.88 m)   Wt 210 lb (95.3 kg)   SpO2 98%   BMI 26.96 kg/m   CV: regular ,  S1/S2 Resp: clear to auscultation bilaterally, normal RR and effort noted GI: soft, no tenderness, with active bowel sounds.   Assessment: Encounter Diagnosis  Name Primary?   Esophageal dysphagia Yes     Plan:  EGD with probable dilation  The benefits and risks of the planned procedure(s) were described in detail with the patient or (when appropriate) their health care proxy.  Risks were outlined as including, but not limited to, bleeding, infection, perforation, adverse medication reaction leading to cardiac or pulmonary decompensation, pancreatitis (if ERCP).  The limitation of incomplete mucosal visualization was also discussed.  No guarantees or warranties were given.  The patient was provided an opportunity to ask questions and all were answered. The patient agreed with the plan.   The patient is appropriate for an endoscopic procedure in the ambulatory setting.   - Victory Brand, MD        [1]  Allergies Allergen Reactions   Amoxicillin Rash    Childhood reaction

## 2024-08-01 NOTE — Op Note (Signed)
 [facility] Patient Name: Gabriel Burgess Procedure Date: 08/01/2024 10:05 AM MRN: [id number] Endoscopist: Victory L. Legrand , MD, [id number] Age: 43 Referring MD:  Date of Birth: Oct 31, 1980 Gender: Male Account #: [id number] Procedure:                Upper GI endoscopy Indications:              Esophageal dysphagia Medicines:                Monitored Anesthesia Care Procedure:                Pre-Anesthesia Assessment:                           - Prior to the procedure, a History and Physical                            was performed, and patient medications and                            allergies were reviewed. The patient's tolerance of                            previous anesthesia was also reviewed. The risks                            and benefits of the procedure and the sedation                            options and risks were discussed with the patient.                            All questions were answered, and informed consent                            was obtained. Prior Anticoagulants: The patient has                            taken Xarelto  (rivaroxaban ), last dose was 2 days                            prior to procedure. ASA Grade Assessment: III - A                            patient with severe systemic disease. After                            reviewing the risks and benefits, the patient was                            deemed in satisfactory condition to undergo the                            procedure.  After obtaining informed consent, the endoscope was                            passed under direct vision. Throughout the                            procedure, the patient's blood pressure, pulse, and                            oxygen saturations were monitored continuously. The                            GIF [id number] #[id number] was introduced through the                            mouth, and advanced to the second part of duodenum.                             The upper GI endoscopy was accomplished without                            difficulty. The patient tolerated the procedure                            well. Scope In: Scope Out: Findings:                 One benign-appearing, intrinsic moderate stenosis                            was found at the gastroesophageal junction. This                            stenosis measured 8 mm (inner diameter) x less than                            one cm (in length). The stenosis was traversed with                            slow steady scope pressure, causing two mucosal                            rents in the process. A guidewire was placed and                            the scope was withdrawn. Dilation was performed                            with a Savary dilator with mild resistance at 12                            mm. The dilation site was examined and showed  moderate mucosal disruption and mild improvement in                            luminal narrowing. Additional ring disruption                            performed with cold bx forceps                           Normal mucosa was found in the entire esophagus.                            Several biopsies were obtained in the upper third                            of the esophagus (Jar 2) and in the lower third of                            the esophagus (Jar 1) with cold forceps for                            histology. (evaluate for changes of GERD or EoE).                            Negative tug sign                           There is no endoscopic evidence of Barrett's                            esophagus, esophagitis, hiatal hernia, areas of                            dilatation or lower esophageal sphincter tone                            abnormalities in the entire esophagus.                           The stomach was normal.                           The cardia and gastric fundus were normal  on                            retroflexion.                           The examined duodenum was normal. Complications:            No immediate complications. Estimated Blood Loss:     Estimated blood loss was minimal. Impression:               - Benign-appearing esophageal stenosis. Dilattion  performed                           - Normal mucosa was found in the entire esophagus.                            Biopsied                           - Normal stomach.                           - Normal examined duodenum.                           - Several biopsies were obtained in the upper third                            of the esophagus and in the lower third of the                            esophagus. Recommendation:           - Patient has a contact number available for                            emergencies. The signs and symptoms of potential                            delayed complications were discussed with the                            patient. Return to normal activities tomorrow.                            Written discharge instructions were provided to the                            patient.                           - Resume previous diet.                           - Resume Xarelto  (rivaroxaban ) at prior dose                            tomorrow.                           - Await pathology results. Clinic follow up will be                            arranged afterward to reassess symptoms after time                            on acid suppression and plan repeat EGD in 2-3  months Alanah Sakuma L. Legrand, MD 08/01/2024 10:42:05 AM This report has been signed electronically.

## 2024-08-01 NOTE — Progress Notes (Signed)
 Vss nad trans to pacu

## 2024-08-01 NOTE — Patient Instructions (Addendum)
 Resume Xarelto  (rivaroxaban ) at prior dose tomorrow. Await pathology results. Clinic follow up will be arranged afterward to reassess symptoms after time on acid suppression and plan repeat EGD in 2-3 months  Take Omeprazole  40 mg twice daily- take 30 minutes before breakfast and dinner   YOU HAD AN ENDOSCOPIC PROCEDURE TODAY AT THE [facility]:   Refer to the procedure report that was given to you for any specific questions about what was found during the examination.  If the procedure report does not answer your questions, please call your gastroenterologist to clarify.  If you requested that your care partner not be given the details of your procedure findings, then the procedure report has been included in a sealed envelope for you to review at your convenience later.  YOU SHOULD EXPECT: Some feelings of bloating in the abdomen. Passage of more gas than usual.  Walking can help get rid of the air that was put into your GI tract during the procedure and reduce the bloating. If you had a lower endoscopy (such as a colonoscopy or flexible sigmoidoscopy) you may notice spotting of blood in your stool or on the toilet paper. If you underwent a bowel prep for your procedure, you may not have a normal bowel movement for a few days.  Please Note:  You might notice some irritation and congestion in your nose or some drainage.  This is from the oxygen used during your procedure.  There is no need for concern and it should clear up in a day or so.  SYMPTOMS TO REPORT IMMEDIATELY:  Following upper endoscopy (EGD)  Vomiting of blood or coffee ground material  New chest pain or pain under the shoulder blades  Painful or persistently difficult swallowing  New shortness of breath  Fever of 100F or higher  Black, tarry-looking stools  For urgent or emergent issues, a gastroenterologist can be reached at any hour by calling [phone]. Do not use MyChart messaging for urgent concerns.     DIET:  We do recommend a small meal at first, but then you may proceed to your regular diet.  Drink plenty of fluids but you should avoid alcoholic beverages for 24 hours.  ACTIVITY:  You should plan to take it easy for the rest of today and you should NOT DRIVE or use heavy machinery until tomorrow (because of the sedation medicines used during the test).    FOLLOW UP: Our staff will call the number listed on your records the next business day following your procedure.  We will call around 7:15- 8:00 am to check on you and address any questions or concerns that you may have regarding the information given to you following your procedure. If we do not reach you, we will leave a message.     If any biopsies were taken you will be contacted by phone or by letter within the next 1-3 weeks.  Please call us  at [phone] if you have not heard about the biopsies in 3 weeks.    SIGNATURES/CONFIDENTIALITY: You and/or your care partner have signed paperwork which will be entered into your electronic medical record.  These signatures attest to the fact that that the information above on your After Visit Summary has been reviewed and is understood.  Full responsibility of the confidentiality of this discharge information lies with you and/or your care-partner.

## 2024-08-01 NOTE — Progress Notes (Signed)
 Diagnosed with Asthma in September. No Asthma attacks. Denies any chest pain or shortness of breath. No home oxygen.

## 2024-08-02 ENCOUNTER — Telehealth: Payer: Self-pay | Admitting: *Deleted

## 2024-08-02 NOTE — Telephone Encounter (Signed)
 No answer for post procedure call back. Left VM.

## 2024-08-05 ENCOUNTER — Other Ambulatory Visit: Payer: Self-pay | Admitting: Primary Care

## 2024-08-08 ENCOUNTER — Ambulatory Visit: Payer: Self-pay | Admitting: Pulmonary Disease

## 2024-08-08 DIAGNOSIS — R069 Unspecified abnormalities of breathing: Secondary | ICD-10-CM | POA: Diagnosis not present

## 2024-08-17 LAB — SURGICAL PATHOLOGY

## 2024-08-19 ENCOUNTER — Encounter: Payer: Self-pay | Admitting: Gastroenterology

## 2024-08-19 ENCOUNTER — Ambulatory Visit: Payer: Self-pay | Admitting: Gastroenterology

## 2024-08-30 NOTE — Progress Notes (Signed)
 Patient is scheduled on 09/30/24 w/Sara Arletta, PA-C

## 2024-09-12 ENCOUNTER — Telehealth: Admitting: Primary Care

## 2024-09-12 ENCOUNTER — Ambulatory Visit: Admitting: Primary Care

## 2024-09-12 DIAGNOSIS — J45909 Unspecified asthma, uncomplicated: Secondary | ICD-10-CM

## 2024-09-12 DIAGNOSIS — K219 Gastro-esophageal reflux disease without esophagitis: Secondary | ICD-10-CM | POA: Diagnosis not present

## 2024-09-12 DIAGNOSIS — G4730 Sleep apnea, unspecified: Secondary | ICD-10-CM

## 2024-09-12 DIAGNOSIS — G4733 Obstructive sleep apnea (adult) (pediatric): Secondary | ICD-10-CM | POA: Diagnosis not present

## 2024-09-15 ENCOUNTER — Encounter: Payer: Self-pay | Admitting: Gastroenterology

## 2024-09-30 ENCOUNTER — Ambulatory Visit: Admitting: Gastroenterology

## 2024-10-03 ENCOUNTER — Ambulatory Visit: Admitting: Gastroenterology

## 2024-10-11 ENCOUNTER — Encounter

## 2024-10-18 ENCOUNTER — Inpatient Hospital Stay: Admitting: Physician Assistant

## 2024-10-18 ENCOUNTER — Inpatient Hospital Stay
